# Patient Record
Sex: Female | Born: 1940 | Race: White | Hispanic: No | Marital: Single | Smoking: Former smoker
Health system: Southern US, Community
[De-identification: ages and names within clinical notes are randomized; demographics above are authoritative.]

## PROBLEM LIST (undated history)

## (undated) DIAGNOSIS — D649 Anemia, unspecified: Secondary | ICD-10-CM

## (undated) DIAGNOSIS — R011 Cardiac murmur, unspecified: Secondary | ICD-10-CM

## (undated) DIAGNOSIS — M810 Age-related osteoporosis without current pathological fracture: Secondary | ICD-10-CM

## (undated) DIAGNOSIS — C801 Malignant (primary) neoplasm, unspecified: Secondary | ICD-10-CM

## (undated) HISTORY — DX: Malignant (primary) neoplasm, unspecified: C80.1

## (undated) HISTORY — DX: Anemia, unspecified: D64.9

## (undated) HISTORY — PX: COLONOSCOPY: SHX174

## (undated) HISTORY — DX: Age-related osteoporosis without current pathological fracture: M81.0

## (undated) HISTORY — PX: OTHER SURGICAL HISTORY: SHX169

## (undated) HISTORY — PX: ADENOIDECTOMY: SUR15

## (undated) HISTORY — PX: TONSILLECTOMY AND ADENOIDECTOMY: SUR1326

## (undated) HISTORY — DX: Cardiac murmur, unspecified: R01.1

## (undated) HISTORY — PX: POLYPECTOMY: SHX149

---

## 1997-10-25 ENCOUNTER — Other Ambulatory Visit: Admission: RE | Admit: 1997-10-25 | Discharge: 1997-10-25 | Payer: Self-pay | Admitting: Obstetrics & Gynecology

## 1998-10-31 ENCOUNTER — Other Ambulatory Visit: Admission: RE | Admit: 1998-10-31 | Discharge: 1998-10-31 | Payer: Self-pay | Admitting: Obstetrics & Gynecology

## 1999-03-02 ENCOUNTER — Ambulatory Visit (HOSPITAL_COMMUNITY): Admission: RE | Admit: 1999-03-02 | Discharge: 1999-03-02 | Payer: Self-pay | Admitting: Gastroenterology

## 1999-04-23 ENCOUNTER — Other Ambulatory Visit: Admission: RE | Admit: 1999-04-23 | Discharge: 1999-04-23 | Payer: Self-pay | Admitting: Obstetrics & Gynecology

## 1999-08-29 ENCOUNTER — Encounter: Admission: RE | Admit: 1999-08-29 | Discharge: 1999-08-29 | Payer: Self-pay | Admitting: Obstetrics & Gynecology

## 1999-08-29 ENCOUNTER — Encounter: Payer: Self-pay | Admitting: Obstetrics & Gynecology

## 1999-10-16 ENCOUNTER — Other Ambulatory Visit: Admission: RE | Admit: 1999-10-16 | Discharge: 1999-10-16 | Payer: Self-pay | Admitting: Obstetrics & Gynecology

## 2000-07-31 ENCOUNTER — Other Ambulatory Visit: Admission: RE | Admit: 2000-07-31 | Discharge: 2000-07-31 | Payer: Self-pay | Admitting: Obstetrics & Gynecology

## 2001-03-09 ENCOUNTER — Other Ambulatory Visit: Admission: RE | Admit: 2001-03-09 | Discharge: 2001-03-09 | Payer: Self-pay | Admitting: Obstetrics & Gynecology

## 2001-08-31 ENCOUNTER — Other Ambulatory Visit: Admission: RE | Admit: 2001-08-31 | Discharge: 2001-08-31 | Payer: Self-pay | Admitting: Obstetrics & Gynecology

## 2002-10-04 ENCOUNTER — Other Ambulatory Visit: Admission: RE | Admit: 2002-10-04 | Discharge: 2002-10-04 | Payer: Self-pay | Admitting: Obstetrics & Gynecology

## 2003-09-28 ENCOUNTER — Encounter: Admission: RE | Admit: 2003-09-28 | Discharge: 2003-09-28 | Payer: Self-pay | Admitting: Obstetrics & Gynecology

## 2003-10-05 ENCOUNTER — Other Ambulatory Visit: Admission: RE | Admit: 2003-10-05 | Discharge: 2003-10-05 | Payer: Self-pay | Admitting: Obstetrics & Gynecology

## 2005-04-22 ENCOUNTER — Other Ambulatory Visit: Admission: RE | Admit: 2005-04-22 | Discharge: 2005-04-22 | Payer: Self-pay | Admitting: Obstetrics & Gynecology

## 2005-08-29 ENCOUNTER — Ambulatory Visit: Payer: Self-pay | Admitting: Gastroenterology

## 2005-10-07 ENCOUNTER — Ambulatory Visit: Payer: Self-pay | Admitting: Gastroenterology

## 2005-10-16 ENCOUNTER — Ambulatory Visit (HOSPITAL_COMMUNITY): Admission: RE | Admit: 2005-10-16 | Discharge: 2005-10-16 | Payer: Self-pay | Admitting: Gastroenterology

## 2005-10-23 ENCOUNTER — Ambulatory Visit: Payer: Self-pay | Admitting: Gastroenterology

## 2008-10-13 ENCOUNTER — Inpatient Hospital Stay (HOSPITAL_COMMUNITY): Admission: RE | Admit: 2008-10-13 | Discharge: 2008-10-14 | Payer: Self-pay | Admitting: Orthopedic Surgery

## 2010-04-12 LAB — URINE MICROSCOPIC-ADD ON

## 2010-04-12 LAB — DIFFERENTIAL
Basophils Absolute: 0 10*3/uL (ref 0.0–0.1)
Basophils Relative: 0 % (ref 0–1)
Eosinophils Absolute: 0 10*3/uL (ref 0.0–0.7)
Eosinophils Relative: 1 % (ref 0–5)
Lymphocytes Relative: 18 % (ref 12–46)
Lymphs Abs: 1.3 10*3/uL (ref 0.7–4.0)
Monocytes Absolute: 0.7 10*3/uL (ref 0.1–1.0)
Monocytes Relative: 10 % (ref 3–12)
Neutro Abs: 5.1 10*3/uL (ref 1.7–7.7)
Neutrophils Relative %: 71 % (ref 43–77)

## 2010-04-12 LAB — CBC
HCT: 43 % (ref 36.0–46.0)
Hemoglobin: 14.7 g/dL (ref 12.0–15.0)
MCHC: 34.2 g/dL (ref 30.0–36.0)
MCHC: 34.3 g/dL (ref 30.0–36.0)
MCV: 97.4 fL (ref 78.0–100.0)
Platelets: 365 10*3/uL (ref 150–400)
RBC: 3.78 MIL/uL — ABNORMAL LOW (ref 3.87–5.11)
RBC: 4.42 MIL/uL (ref 3.87–5.11)
RDW: 13.4 % (ref 11.5–15.5)
RDW: 13.6 % (ref 11.5–15.5)
WBC: 7.2 10*3/uL (ref 4.0–10.5)

## 2010-04-12 LAB — BASIC METABOLIC PANEL
BUN: 8 mg/dL (ref 6–23)
CO2: 22 mEq/L (ref 19–32)
CO2: 24 mEq/L (ref 19–32)
Calcium: 8.6 mg/dL (ref 8.4–10.5)
Calcium: 9.1 mg/dL (ref 8.4–10.5)
Chloride: 104 mEq/L (ref 96–112)
Creatinine, Ser: 0.89 mg/dL (ref 0.4–1.2)
Creatinine, Ser: 0.9 mg/dL (ref 0.4–1.2)
GFR calc Af Amer: >60 mL/min (ref >60)
GFR calc Af Amer: >60 mL/min (ref >60)
GFR calc non Af Amer: >60 mL/min (ref >60)
Glucose, Bld: 94 mg/dL (ref 70–99)
Potassium: 3.6 mEq/L (ref 3.5–5.1)
Sodium: 139 mEq/L (ref 135–145)

## 2010-04-12 LAB — PROTIME-INR: INR: 0.98 (ref 0.00–1.49)

## 2010-04-12 LAB — URINALYSIS, ROUTINE W REFLEX MICROSCOPIC
Glucose, UA: NEGATIVE mg/dL
Hgb urine dipstick: NEGATIVE
Specific Gravity, Urine: 1.027 (ref 1.005–1.030)
Urobilinogen, UA: 0.2 mg/dL (ref 0.0–1.0)
pH: 6 (ref 5.0–8.0)

## 2010-05-25 NOTE — Assessment & Plan Note (Signed)
[facility]                           GASTROENTEROLOGY OFFICE NOTE   Carmen, Rose                     MRN:          [id number]  DATE:08/29/2005                            DOB:          May 24, 1940    REASON FOR CONSULTATION:  History of colonic polyps.   HISTORY OF PRESENT ILLNESS:  Carmen Rose is a pleasant 70 year old white  female referred through the courtesy of Dr. Clelia Croft for evaluation.  She has a  history of adenomatous colon polyps.  She was last examined in 2001.  She  has no GI complaints, including abdominal pain, change in bowel habits,  melena or hematochezia.   PAST MEDICAL HISTORY:  Pertinent for arthritis.  She has a history of mild  depression and skin cancer.   FAMILY HISTORY:  Noncontributory.   MEDICATIONS:  Only medication is a baby aspirin and multivitamins.   ALLERGIES:  SHE HAS NO ALLERGIES.   SOCIAL HISTORY:  She quit smoking this year.  She drinks rarely.  She is  single and works for [employer]   REVIEW OF SYSTEMS:  Was reviewed and is negative.   PHYSICAL EXAMINATION:  VITAL SIGNS:  On exam, pulse 84, blood pressure  108/60, weight 140.  HEENT: EOMI. PERRLA. Sclerae are anicteric.  Conjunctivae are pink.  NECK:  Supple without thyromegaly, adenopathy or carotid bruits.  CHEST:  Clear to auscultation and percussion without adventitious sounds.  CARDIAC:  Regular rhythm; normal S1 S2.  There are no murmurs, gallops or  rubs.  ABDOMEN:  Bowel sounds are normoactive.  Abdomen is soft, non-tender and non-  distended.  There are no abdominal masses, tenderness, splenic enlargement  or hepatomegaly.  EXTREMITIES:  Full range of motion.  No cyanosis, clubbing or edema.  RECTAL:  Deferred.   IMPRESSION:  History of colonic polyps.   RECOMMENDATION:  Colonoscopy.                                   Barbette Hair. Arlyce Dice, MD, [facility]   RDK/MedQ  DD:  08/29/2005  DT:  08/29/2005  Job #:  [id number]   cc:   Kari Baars, MD

## 2010-09-24 ENCOUNTER — Other Ambulatory Visit: Payer: Self-pay | Admitting: Internal Medicine

## 2010-09-24 DIAGNOSIS — Z1231 Encounter for screening mammogram for malignant neoplasm of breast: Secondary | ICD-10-CM

## 2010-10-08 ENCOUNTER — Ambulatory Visit
Admission: RE | Admit: 2010-10-08 | Discharge: 2010-10-08 | Disposition: A | Discharge status: Home / Self Care | Payer: Medicare Other | Source: Ambulatory Visit | Attending: Internal Medicine | Admitting: Internal Medicine

## 2010-10-08 DIAGNOSIS — Z1231 Encounter for screening mammogram for malignant neoplasm of breast: Secondary | ICD-10-CM

## 2011-10-14 ENCOUNTER — Telehealth: Payer: Self-pay | Admitting: Gastroenterology

## 2011-10-14 ENCOUNTER — Other Ambulatory Visit: Payer: Self-pay | Admitting: Internal Medicine

## 2011-10-14 DIAGNOSIS — Z1231 Encounter for screening mammogram for malignant neoplasm of breast: Secondary | ICD-10-CM

## 2011-10-14 NOTE — Telephone Encounter (Signed)
Pt has a form from [employer] to have the date of her colonoscopy and where and when it was done. States if she fills this out she can get a prepaid visa card sent to her. Let pt know she could bring the form in and let us look at it to see if it is something that we could fill out for her. Pt verbalized understanding.

## 2011-11-11 ENCOUNTER — Ambulatory Visit
Admission: RE | Admit: 2011-11-11 | Discharge: 2011-11-11 | Disposition: A | Discharge status: Home / Self Care | Payer: Medicare Other | Source: Ambulatory Visit | Attending: Internal Medicine | Admitting: Internal Medicine

## 2011-11-11 DIAGNOSIS — Z1231 Encounter for screening mammogram for malignant neoplasm of breast: Secondary | ICD-10-CM

## 2012-08-14 ENCOUNTER — Encounter: Payer: Self-pay | Admitting: Gastroenterology

## 2012-11-26 ENCOUNTER — Other Ambulatory Visit: Payer: Self-pay | Admitting: Internal Medicine

## 2012-11-26 DIAGNOSIS — Z1231 Encounter for screening mammogram for malignant neoplasm of breast: Secondary | ICD-10-CM

## 2012-11-26 DIAGNOSIS — Z780 Asymptomatic menopausal state: Secondary | ICD-10-CM

## 2012-11-26 DIAGNOSIS — M810 Age-related osteoporosis without current pathological fracture: Secondary | ICD-10-CM

## 2012-12-21 ENCOUNTER — Ambulatory Visit
Admission: RE | Admit: 2012-12-21 | Discharge: 2012-12-21 | Disposition: A | Discharge status: Home / Self Care | Payer: Medicare Other | Source: Ambulatory Visit | Attending: Internal Medicine | Admitting: Internal Medicine

## 2012-12-21 DIAGNOSIS — M810 Age-related osteoporosis without current pathological fracture: Secondary | ICD-10-CM

## 2012-12-21 DIAGNOSIS — Z1231 Encounter for screening mammogram for malignant neoplasm of breast: Secondary | ICD-10-CM

## 2012-12-21 DIAGNOSIS — Z780 Asymptomatic menopausal state: Secondary | ICD-10-CM

## 2013-01-11 ENCOUNTER — Telehealth: Payer: Self-pay | Admitting: Gastroenterology

## 2013-01-11 NOTE — Telephone Encounter (Signed)
Pt had follow-up colon done 10/2005, no intervention was required. Pt had some adenomatous polyps on previous colon in 2001. Pt received a recall letter and she is calling stating that her last procedure was done at the hospital. Pt wants to know if she needs next procedure done at the hospital. Please advise.

## 2013-01-11 NOTE — Telephone Encounter (Signed)
I need a copy of her colonoscopy reports

## 2013-01-13 ENCOUNTER — Encounter: Payer: Self-pay | Admitting: Gastroenterology

## 2013-01-13 NOTE — Telephone Encounter (Signed)
Colon reports placed on Dr. Kelby Fam desk for review.

## 2013-01-14 NOTE — Telephone Encounter (Signed)
Does not have to be done in the hospital.

## 2013-01-15 NOTE — Telephone Encounter (Signed)
Left message for pt to call back  °

## 2013-01-18 NOTE — Telephone Encounter (Signed)
Pt aware and previsit and colon appts scheduled. Pt aware of appts.

## 2013-02-05 ENCOUNTER — Encounter: Payer: Self-pay | Admitting: Gastroenterology

## 2013-03-01 ENCOUNTER — Ambulatory Visit (AMBULATORY_SURGERY_CENTER): Payer: Self-pay | Admitting: *Deleted

## 2013-03-01 ENCOUNTER — Telehealth: Payer: Self-pay | Admitting: Gastroenterology

## 2013-03-01 VITALS — Ht 67.0 in | Wt 140.8 lb

## 2013-03-01 DIAGNOSIS — Z86010 Personal history of colonic polyps: Secondary | ICD-10-CM

## 2013-03-01 MED ORDER — MOVIPREP 100 G PO SOLR: 1.0000 | Freq: Once | ORAL | Status: DC

## 2013-03-01 NOTE — Telephone Encounter (Signed)
Spoke with patient. She is unable to pay for the $70 Moviprep. Coupon left at front desk for "free" moviprep, patient will come by 4 th floor desk to pick this up tomorrow. Patient verbally understands. Thanks, RM

## 2013-03-01 NOTE — Progress Notes (Signed)
No soy allergy or egg allergy. ewm No home 02 or cpap use. ewm No problems with past sedation. ewm The smell of the 02 tubing makes pt nauseated. Please place after sedated. ewm Pt declined emmi video. ewm

## 2013-03-03 ENCOUNTER — Telehealth: Payer: Self-pay | Admitting: Gastroenterology

## 2013-03-03 NOTE — Telephone Encounter (Signed)
Pt wants [place] to be aware that she has a rash on her back that has been there since November.  She did not want [place] to think that it was new.  Instructed pt to make nurse in procedure room aware day of procedure.

## 2013-03-04 ENCOUNTER — Encounter: Payer: Self-pay | Admitting: Gastroenterology

## 2013-03-08 ENCOUNTER — Encounter: Payer: Self-pay | Admitting: Gastroenterology

## 2013-03-08 ENCOUNTER — Ambulatory Visit (AMBULATORY_SURGERY_CENTER): Payer: Medicare Other | Admitting: Gastroenterology

## 2013-03-08 ENCOUNTER — Encounter: Payer: Medicare Other | Admitting: Gastroenterology

## 2013-03-08 VITALS — BP 137/65 | HR 74 | Temp 96.7°F | Resp 15 | Ht 67.0 in | Wt 140.0 lb

## 2013-03-08 DIAGNOSIS — Z86010 Personal history of colonic polyps: Secondary | ICD-10-CM

## 2013-03-08 MED ORDER — SODIUM CHLORIDE 0.9 % IV SOLN: 500.0000 mL | INTRAVENOUS | Status: DC

## 2013-03-08 NOTE — Op Note (Signed)
[place]  [address] [place], [id number]   COLONOSCOPY PROCEDURE REPORT  PATIENT: Carmen, Rose  MR#: [id number] BIRTHDATE: 07-09-1940 , 75  yrs. old GENDER: Female ENDOSCOPIST: Inda Castle, MD REFERRED DZ:[id number] Dione Housekeeper, M.D. PROCEDURE DATE:  03/08/2013 PROCEDURE:   Colonoscopy, diagnostic First Screening Colonoscopy - Avg.  risk and is 50 yrs.  old or older - No.  Prior Negative Screening - Now for repeat screening. N/A  History of Adenoma - Now for follow-up colonoscopy & has been > or = to 3 yrs.  Yes hx of adenoma.  Has been 3 or more years since last colonoscopy.  Polyps Removed Today? No.  Recommend repeat exam, <10 yrs? No. ASA CLASS:   Class II INDICATIONS:Patient's personal history of adenomatous colon polyps. 1998.  2007 colonoscopy negative for polyps MEDICATIONS: MAC sedation, administered by CRNA and propofol (Diprivan) 200mg  IV  DESCRIPTION OF PROCEDURE:   After the risks benefits and alternatives of the procedure were thoroughly explained, informed consent was obtained.  A digital rectal exam revealed no abnormalities of the rectum.   The LB [id number]  endoscope was introduced through the anus and advanced to the cecum, which was identified by both the appendix and ileocecal valve. No adverse events experienced.   The quality of the prep was Suprep good  The instrument was then slowly withdrawn as the colon was fully examined.      COLON FINDINGS: A normal appearing cecum, ileocecal valve, and appendiceal orifice were identified.  The ascending, hepatic flexure, transverse, splenic flexure, descending, sigmoid colon and rectum appeared unremarkable.  No polyps or cancers were seen. Retroflexed views revealed no abnormalities. The time to cecum=4 minutes 07 seconds.  Withdrawal time=8 minutes 15 seconds.  The scope was withdrawn and the procedure completed. COMPLICATIONS: There were no complications.  ENDOSCOPIC  IMPRESSION: Normal colon  RECOMMENDATIONS: Given your age, you will not need another colonoscopy for colon cancer screening or polyp surveillance.  These types of tests usually stop around the age 10.   eSigned:  Inda Castle, MD 03/08/2013 2:01 PM   cc:   PATIENT NAME:  Carmen, Rose MR#: [id number]

## 2013-03-08 NOTE — Patient Instructions (Signed)
Impressions/recommendations:  Normal colon  YOU HAD AN ENDOSCOPIC PROCEDURE TODAY AT [facility]: Refer to the procedure report that was given to you for any specific questions about what was found during the examination.  If the procedure report does not answer your questions, please call your gastroenterologist to clarify.  If you requested that your care partner not be given the details of your procedure findings, then the procedure report has been included in a sealed envelope for you to review at your convenience later.  YOU SHOULD EXPECT: Some feelings of bloating in the abdomen. Passage of more gas than usual.  Walking can help get rid of the air that was put into your GI tract during the procedure and reduce the bloating. If you had a lower endoscopy (such as a colonoscopy or flexible sigmoidoscopy) you may notice spotting of blood in your stool or on the toilet paper. If you underwent a bowel prep for your procedure, then you may not have a normal bowel movement for a few days.  DIET: Your first meal following the procedure should be a light meal and then it is ok to progress to your normal diet.  A half-sandwich or bowl of soup is an example of a good first meal.  Heavy or fried foods are harder to digest and may make you feel nauseous or bloated.  Likewise meals heavy in dairy and vegetables can cause extra gas to form and this can also increase the bloating.  Drink plenty of fluids but you should avoid alcoholic beverages for 24 hours.  ACTIVITY: Your care partner should take you home directly after the procedure.  You should plan to take it easy, moving slowly for the rest of the day.  You can resume normal activity the day after the procedure however you should NOT DRIVE or use heavy machinery for 24 hours (because of the sedation medicines used during the test).    SYMPTOMS TO REPORT IMMEDIATELY: A gastroenterologist can be reached at any hour.  During normal business  hours, 8:30 AM to 5:00 PM Monday through Friday, call [phone].  After hours and on weekends, please call the GI answering service at [phone] who will take a message and have the physician on call contact you.   Following lower endoscopy (colonoscopy or flexible sigmoidoscopy):  Excessive amounts of blood in the stool  Significant tenderness or worsening of abdominal pains  Swelling of the abdomen that is new, acute  Fever of 100F or higher  FOLLOW UP: If any biopsies were taken you will be contacted by phone or by letter within the next 1-3 weeks.  Call your gastroenterologist if you have not heard about the biopsies in 3 weeks.  Our staff will call the home number listed on your records the next business day following your procedure to check on you and address any questions or concerns that you may have at that time regarding the information given to you following your procedure. This is a courtesy call and so if there is no answer at the home number and we have not heard from you through the emergency physician on call, we will assume that you have returned to your regular daily activities without incident.  SIGNATURES/CONFIDENTIALITY: You and/or your care partner have signed paperwork which will be entered into your electronic medical record.  These signatures attest to the fact that that the information above on your After Visit Summary has been reviewed and is understood.  Full responsibility  of the confidentiality of this discharge information lies with you and/or your care-partner.

## 2013-03-08 NOTE — Progress Notes (Signed)
Report to pacu rn, vss, bbs=clear 

## 2013-03-09 ENCOUNTER — Telehealth: Payer: Self-pay | Admitting: *Deleted

## 2013-03-09 NOTE — Telephone Encounter (Signed)
  Follow up Call-  Call back number 03/08/2013  Post procedure Call Back phone  # [phone]  Permission to leave phone message Yes     Patient questions:  Do you have a fever, pain , or abdominal swelling? no Pain Score  0 *  Have you tolerated food without any problems? yes  Have you been able to return to your normal activities? yes  Do you have any questions about your discharge instructions: Diet   no Medications  no Follow up visit  no  Do you have questions or concerns about your Care? no  Actions: * If pain score is 4 or above: No action needed, pain <4.

## 2014-01-25 ENCOUNTER — Other Ambulatory Visit: Payer: Self-pay

## 2014-01-25 DIAGNOSIS — Z1231 Encounter for screening mammogram for malignant neoplasm of breast: Secondary | ICD-10-CM

## 2014-01-31 ENCOUNTER — Ambulatory Visit: Payer: Self-pay

## 2014-02-07 ENCOUNTER — Ambulatory Visit
Admission: RE | Admit: 2014-02-07 | Discharge: 2014-02-07 | Disposition: A | Discharge status: Home / Self Care | Payer: Medicare Other | Source: Ambulatory Visit

## 2014-02-07 DIAGNOSIS — Z1231 Encounter for screening mammogram for malignant neoplasm of breast: Secondary | ICD-10-CM

## 2014-08-02 ENCOUNTER — Other Ambulatory Visit: Payer: Self-pay | Admitting: Internal Medicine

## 2014-08-02 DIAGNOSIS — M810 Age-related osteoporosis without current pathological fracture: Secondary | ICD-10-CM

## 2014-12-23 ENCOUNTER — Other Ambulatory Visit: Payer: Medicare Other

## 2014-12-26 ENCOUNTER — Ambulatory Visit
Admission: RE | Admit: 2014-12-26 | Discharge: 2014-12-26 | Disposition: A | Discharge status: Home / Self Care | Payer: Medicare Other | Source: Ambulatory Visit | Attending: Internal Medicine | Admitting: Internal Medicine

## 2014-12-26 DIAGNOSIS — M810 Age-related osteoporosis without current pathological fracture: Secondary | ICD-10-CM

## 2015-01-23 ENCOUNTER — Other Ambulatory Visit: Payer: Self-pay

## 2015-01-23 DIAGNOSIS — Z1231 Encounter for screening mammogram for malignant neoplasm of breast: Secondary | ICD-10-CM

## 2015-02-13 ENCOUNTER — Ambulatory Visit
Admission: RE | Admit: 2015-02-13 | Discharge: 2015-02-13 | Disposition: A | Discharge status: Home / Self Care | Payer: Medicare Other | Source: Ambulatory Visit

## 2015-02-13 DIAGNOSIS — Z1231 Encounter for screening mammogram for malignant neoplasm of breast: Secondary | ICD-10-CM

## 2015-07-08 ENCOUNTER — Encounter (HOSPITAL_COMMUNITY): Payer: Self-pay

## 2015-07-08 ENCOUNTER — Emergency Department (HOSPITAL_COMMUNITY)
Admission: EM | Admit: 2015-07-08 | Discharge: 2015-07-08 | Disposition: A | Discharge status: Home / Self Care | Payer: Worker's Compensation | Attending: Emergency Medicine | Admitting: Emergency Medicine

## 2015-07-08 ENCOUNTER — Emergency Department (HOSPITAL_COMMUNITY): Payer: Worker's Compensation

## 2015-07-08 DIAGNOSIS — W010XXA Fall on same level from slipping, tripping and stumbling without subsequent striking against object, initial encounter: Secondary | ICD-10-CM | POA: Diagnosis not present

## 2015-07-08 DIAGNOSIS — M810 Age-related osteoporosis without current pathological fracture: Secondary | ICD-10-CM | POA: Insufficient documentation

## 2015-07-08 DIAGNOSIS — Z7982 Long term (current) use of aspirin: Secondary | ICD-10-CM | POA: Diagnosis not present

## 2015-07-08 DIAGNOSIS — Y929 Unspecified place or not applicable: Secondary | ICD-10-CM | POA: Insufficient documentation

## 2015-07-08 DIAGNOSIS — Z79899 Other long term (current) drug therapy: Secondary | ICD-10-CM | POA: Insufficient documentation

## 2015-07-08 DIAGNOSIS — S8991XA Unspecified injury of right lower leg, initial encounter: Secondary | ICD-10-CM | POA: Diagnosis present

## 2015-07-08 DIAGNOSIS — Y9389 Activity, other specified: Secondary | ICD-10-CM | POA: Diagnosis not present

## 2015-07-08 DIAGNOSIS — Z87891 Personal history of nicotine dependence: Secondary | ICD-10-CM | POA: Insufficient documentation

## 2015-07-08 DIAGNOSIS — Z85828 Personal history of other malignant neoplasm of skin: Secondary | ICD-10-CM | POA: Diagnosis not present

## 2015-07-08 DIAGNOSIS — Y990 Civilian activity done for income or pay: Secondary | ICD-10-CM | POA: Diagnosis not present

## 2015-07-08 DIAGNOSIS — S82001A Unspecified fracture of right patella, initial encounter for closed fracture: Secondary | ICD-10-CM | POA: Insufficient documentation

## 2015-07-08 MED ORDER — OXYCODONE-ACETAMINOPHEN 5-325 MG PO TABS
1.0000 | ORAL_TABLET | Freq: Once | ORAL | Status: AC
  Administered 2015-07-08: 1 via ORAL
  Filled 2015-07-08: qty 1

## 2015-07-08 MED ORDER — HYDROCODONE-ACETAMINOPHEN 5-325 MG PO TABS: 1.0000 | ORAL_TABLET | ORAL | Status: DC | PRN

## 2015-07-08 NOTE — ED Notes (Signed)
Pt c/o R knee pain r/t a trip and fall.  Pain score 8/10 w/ movement.  Swelling noted.

## 2015-07-08 NOTE — ED Provider Notes (Signed)
CSN: [id number]     Arrival date & time 07/08/15  1057 History   First MD Initiated Contact with Patient 07/08/15 1146     Chief Complaint  Patient presents with  . Fall  . Knee Pain   HPI Comments: 75 year old female presents with right anterior knee pain after a fall earlier today. Past medical history significant for anemia and osteoporosis. Patient states she was works at [employer] and she tripped over a cord. She fell directly onto her right knee. She also reports bruising on her left wrist however she states it is not painful. She denies pain when lying still however reports pain with range of motion. Denies numbness or tingling. She denies blood thinners but does take 81mg  ASA.  Patient is a 75 y.o. female presenting with fall and knee pain.  Fall Associated symptoms include arthralgias. Pertinent negatives include no numbness or weakness.  Knee Pain   Past Medical History  Diagnosis Date  . Anemia     50 yrs ago  . Cancer ([place])     skin cancer right arm  . Heart murmur     prolapsed mitral valve  . Osteoporosis    Past Surgical History  Procedure Laterality Date  . Right arm fracture      humerus  . Colonoscopy    . Tonsillectomy and adenoidectomy    . Adenoidectomy    . Polypectomy     Family History  Problem Relation Age of Onset  . Colon cancer Neg Hx   . Rectal cancer Neg Hx   . Stomach cancer Neg Hx   . Lung cancer Father   . Cervical cancer Mother   . Heart disease Mother    Social History  Substance Use Topics  . Smoking status: Former Research scientist (life sciences)  . Smokeless tobacco: Never Used  . Alcohol Use: Yes     Comment: occasional   OB History    No data available     Review of Systems  Musculoskeletal: Positive for arthralgias and gait problem.  Neurological: Negative for syncope, weakness and numbness.  All other systems reviewed and are negative.     Allergies  Milk-related compounds; Other; Sudafed; Adhesive; and Caladryl  Home Medications    Prior to Admission medications   Medication Sig Start Date End Date Taking? Authorizing Provider  aspirin 81 MG chewable tablet Chew 81 mg by mouth daily.   Yes Historical Provider, MD  Biotin 1000 MCG tablet Take 500 mcg by mouth daily.   Yes Historical Provider, MD  Calcium Carb-Cholecalciferol (CALCIUM 600+D3) 600-800 MG-UNIT TABS Take 1 capsule by mouth 2 (two) times daily before a meal.   Yes Historical Provider, MD  Cholecalciferol (VITAMIN D-3 PO) Take 1,000 Units by mouth daily.   Yes Historical Provider, MD  Multiple Vitamins-Minerals (MULTIVITAMIN PO) Take 1 capsule by mouth daily.   Yes Historical Provider, MD  HYDROcodone-acetaminophen (NORCO/VICODIN) 5-325 MG tablet Take 1-2 tablets by mouth every 4 (four) hours as needed. 07/08/15   Recardo Evangelist, PA-C   BP 141/80 mmHg  Pulse 71  Temp(Src) 97.8 F (36.6 C) (Oral)  Resp 17  SpO2 97%   Physical Exam  Constitutional: She is oriented to person, place, and time. She appears well-developed and well-nourished. No distress.  HENT:  Head: Normocephalic and atraumatic.  Eyes: Conjunctivae are normal. Pupils are equal, round, and reactive to light. Right eye exhibits no discharge. Left eye exhibits no discharge. No scleral icterus.  Neck: Normal range of motion.  Cardiovascular: Normal rate.   Pulmonary/Chest: Effort normal. No respiratory distress.  Abdominal: She exhibits no distension.  Musculoskeletal:  Right kneel: Moderate swelling. No deformity. Significant tenderness to palpation over the superior anterior patella. Decreased ROM. N/V intact.    Neurological: She is alert and oriented to person, place, and time.  Skin: Skin is warm and dry.  Psychiatric: She has a normal mood and affect. Her behavior is normal.    ED Course  Procedures (including critical care time) Labs Review Labs Reviewed - No data to display  Imaging Review Dg Knee Complete 4 Views Right  07/08/2015  CLINICAL DATA:  Recent fall with  anterior knee pain, initial and EXAM: RIGHT KNEE - COMPLETE 4+ VIEW COMPARISON:  None. FINDINGS: Superior patellar fracture is noted with associated joint effusion. No other bony abnormality is seen. IMPRESSION: Superior patellar fracture with associated joint effusion. Electronically Signed   By: Inez Catalina M.D.   On: 07/08/2015 11:46   I have personally reviewed and evaluated these images and lab results as part of my medical decision-making.   EKG Interpretation None      MDM   Final diagnoses:  Patella fracture, right, closed, initial encounter   75 year old female presents with a patella fracture s/p mechanical fall. Patient is afebrile, not tachycardic or tachypneic, and not hypoxic. BP mildly elevated. Percocet given for pain. Xray shows patella fracture. Knee immobilizer applied. Pt has orthopedic doctor and will follow up. Patient is NAD, non-toxic, with stable VS. Patient is informed of clinical course, understands medical decision making process, and agrees with plan. Opportunity for questions provided and all questions answered. Return precautions given.      Recardo Evangelist, PA-C 07/08/15 [place], DO 07/08/15 1428

## 2015-07-08 NOTE — Discharge Instructions (Signed)
Knee Fracture, Adult A knee fracture is a break in a bone of the knee. The break may be in the kneecap (patella), the lower part of the thigh bone (femur), or the upper part of the shin bone (tibia). There are several types of fractures. They include:  Stable. In this type of fracture, the bones of the knee remain in place after the break.  Displaced. In this type of fracture, the bones no longer line up after the break.  Comminuted. In this type of fracture, the bone breaks into several pieces.  Open. In this type of fracture, the broken bone comes through the skin. CAUSES This injury is usually caused by a fall. This injury can happen because of the impact of the fall or from a violent contraction of the leg muscles before you hit the ground. It can also result from a car accident or a collision with a hard surface. RISK FACTORS This injury is more likely to develop in people who:  Are female.  Are 90-62 years old.  Participate in high-energy sports.  Have a condition that weakens the bones, such as osteoporosis.  Have had a knee replacement. SYMPTOMS Symptoms of this injury include:  Pain.  Swelling.  Bruising.  Inability to bend your knee.  Misshapen knee.  Inability to walk.  Inability to use your injured leg to support your body weight. DIAGNOSIS This injury is diagnosed with a physical exam. Your health care provider may also order:  Imaging studies, such as an X-ray, CT scan, MRI scan, or ultrasound.  A procedure called arthroscopy to view the inside of your knee with a small camera. TREATMENT Treatment for this injury may involve:  Wearing a splint until swelling goes down.  Wearing a cast to keep the fractured bone from moving while it heals. A cast is usually put on after swelling has gone down.  Surgery to move a bone back into place. HOME CARE INSTRUCTIONS If You Have a Splint:  Wear it as directed by your health care provider. Remove it only as  directed by your health care provider.  Loosen the splint if your toes become numb and tingle, or if they turn cold and blue.  Do not put pressure on any part of your splint. If You Have a Cast:  Do not stick anything inside the cast to scratch your skin. Doing that increases your risk of infection.  Check the skin around the cast every day. Report any concerns to your health care provider. You may put lotion on dry skin around the edges of the cast. Do not apply lotion to the skin underneath the cast. Bathing  Cover the cast or splint with a watertight plastic bag to protect it from water while you take a bath or a shower. Do not let the cast or splint get wet. Managing Pain, Stiffness, and Swelling  If directed, apply ice to the injured area:  Put ice in a plastic bag.  Place a towel between your skin and the bag.  Leave the ice on for 20 minutes, 2-3 times a day.  Move your toes often to avoid stiffness and to lessen swelling.  Raise the injured area above the level of your heart while you are lying down. Driving  Do not drive or operate heavy machinery while taking pain medicine.  Do not drive while wearing a cast or splint on a hand or foot that you use for driving. Activity  Return to your normal activities as directed  by your health care provider. Ask your health care provider what activities are safe for you. General Instructions  Do not put pressure on any part of the cast or splint until it is fully hardened. This may take several hours.  Keep the cast or splint clean and dry.  Do not use any tobacco products, including cigarettes, chewing tobacco, or electronic cigarettes. Tobacco can delay bone healing. If you need help quitting, ask your health care provider.  Take medicines only as directed by your health care provider.  Keep all follow-up visits as directed by your health care provider. This is important. SEEK MEDICAL CARE IF:  You have knee pain and  swelling.  You have trouble walking.  Your cast becomes wet or damaged or suddenly feels too tight. SEEK IMMEDIATE MEDICAL CARE IF:  Your pain and swelling get worse.  You have severe pain below the fracture.  Your skin or toenails turn blue or gray, feel cold, or become numb.  You have fluid, blood, or pus coming from under your cast.   This information is not intended to replace advice given to you by your health care provider. Make sure you discuss any questions you have with your health care provider.   Document Released: 11/06/2005 Document Revised: 01/14/2014 Document Reviewed: 08/25/2013 Elsevier Interactive Patient Education [employer].

## 2016-01-30 ENCOUNTER — Other Ambulatory Visit: Payer: Self-pay | Admitting: Internal Medicine

## 2016-01-30 DIAGNOSIS — Z1231 Encounter for screening mammogram for malignant neoplasm of breast: Secondary | ICD-10-CM

## 2016-02-19 ENCOUNTER — Ambulatory Visit
Admission: RE | Admit: 2016-02-19 | Discharge: 2016-02-19 | Disposition: A | Discharge status: Home / Self Care | Payer: Medicare Other | Source: Ambulatory Visit | Attending: Internal Medicine | Admitting: Internal Medicine

## 2016-02-19 DIAGNOSIS — Z1231 Encounter for screening mammogram for malignant neoplasm of breast: Secondary | ICD-10-CM

## 2017-01-01 ENCOUNTER — Encounter (HOSPITAL_COMMUNITY): Payer: Self-pay | Admitting: Nurse Practitioner

## 2017-01-01 ENCOUNTER — Emergency Department (HOSPITAL_COMMUNITY): Payer: Medicare Other

## 2017-01-01 ENCOUNTER — Emergency Department (HOSPITAL_COMMUNITY)
Admission: EM | Admit: 2017-01-01 | Discharge: 2017-01-01 | Disposition: A | Discharge status: Home / Self Care | Payer: Medicare Other | Attending: Emergency Medicine | Admitting: Emergency Medicine

## 2017-01-01 ENCOUNTER — Other Ambulatory Visit: Payer: Self-pay

## 2017-01-01 DIAGNOSIS — S29012A Strain of muscle and tendon of back wall of thorax, initial encounter: Secondary | ICD-10-CM | POA: Diagnosis not present

## 2017-01-01 DIAGNOSIS — T148XXA Other injury of unspecified body region, initial encounter: Secondary | ICD-10-CM

## 2017-01-01 DIAGNOSIS — Z79899 Other long term (current) drug therapy: Secondary | ICD-10-CM | POA: Diagnosis not present

## 2017-01-01 DIAGNOSIS — S298XXA Other specified injuries of thorax, initial encounter: Secondary | ICD-10-CM | POA: Diagnosis present

## 2017-01-01 DIAGNOSIS — M546 Pain in thoracic spine: Secondary | ICD-10-CM

## 2017-01-01 DIAGNOSIS — Y93H2 Activity, gardening and landscaping: Secondary | ICD-10-CM | POA: Diagnosis not present

## 2017-01-01 DIAGNOSIS — Y929 Unspecified place or not applicable: Secondary | ICD-10-CM | POA: Insufficient documentation

## 2017-01-01 DIAGNOSIS — Z85828 Personal history of other malignant neoplasm of skin: Secondary | ICD-10-CM | POA: Diagnosis not present

## 2017-01-01 DIAGNOSIS — Z7982 Long term (current) use of aspirin: Secondary | ICD-10-CM | POA: Diagnosis not present

## 2017-01-01 DIAGNOSIS — Y999 Unspecified external cause status: Secondary | ICD-10-CM | POA: Insufficient documentation

## 2017-01-01 DIAGNOSIS — Z87891 Personal history of nicotine dependence: Secondary | ICD-10-CM | POA: Diagnosis not present

## 2017-01-01 DIAGNOSIS — W010XXA Fall on same level from slipping, tripping and stumbling without subsequent striking against object, initial encounter: Secondary | ICD-10-CM | POA: Diagnosis not present

## 2017-01-01 NOTE — ED Provider Notes (Signed)
[facility] DEPT Provider Note   CSN: [id number] Arrival date & time: 01/01/17  4580     History   Chief Complaint Chief Complaint  Patient presents with  . Back Pain  . Fall    HPI Carmen Rose is a 76 y.o. female.  HPI  76 year old female presents with concern for fall yesterday with left-sided rib and back pain.  Reports that she was carrying 2 bags of leaves, and 1 got caught on something, and pulled her and she lost her balance and fell down.  Reports she fell forwards, landing on her knees and hand and bracing herself.  Reports she skinned her knee but does not have any significant pain there and has been ambulating without problems.  Denies any hand pain.  Reports her glasses had also slightly cut the side of her face.  Denies headache, head trauma, nausea or vomiting.  Denies neck pain.  Denies numbness, weakness.  Reports she continued 2 hours after the fall initially, and about 1 hour later noted at epigastric and left upper quadrant pain, and last night noted mid back pain.  Reports the pain is worse with movements and palpation.  Denies chest pain, shortness of breath.  Pain is well controlled unless she lays on it wrong or moves.  Back pain does come and go.   Past Medical History:  Diagnosis Date  . Anemia    50 yrs ago  . Cancer ([place])    skin cancer right arm  . Heart murmur    prolapsed mitral valve  . Osteoporosis     There are no active problems to display for this patient.   Past Surgical History:  Procedure Laterality Date  . ADENOIDECTOMY    . COLONOSCOPY    . POLYPECTOMY    . right arm fracture     humerus  . TONSILLECTOMY AND ADENOIDECTOMY      OB History    No data available       Home Medications    Prior to Admission medications   Medication Sig Start Date End Date Taking? Authorizing Provider  aspirin 81 MG chewable tablet Chew 81 mg by mouth daily.   Yes [provider]  Biotin 1000 MCG  tablet Take 500 mcg by mouth daily.   Yes [provider]  Calcium Carb-Cholecalciferol (CALCIUM 600+D3) 600-800 MG-UNIT TABS Take 1 capsule by mouth 2 (two) times daily before a meal.   Yes [provider]  Cholecalciferol (VITAMIN D-3 PO) Take 1,000 Units by mouth daily.   Yes [provider]  ibuprofen (ADVIL,MOTRIN) 200 MG tablet Take 200 mg by mouth every 6 (six) hours as needed for mild pain.   Yes [provider]  Multiple Vitamins-Minerals (MULTIVITAMIN PO) Take 1 capsule by mouth daily.   Yes [provider]  HYDROcodone-acetaminophen (NORCO/VICODIN) 5-325 MG tablet Take 1-2 tablets by mouth every 4 (four) hours as needed. Patient not taking: Reported on 01/01/2017 07/08/15   Recardo Evangelist, PA-C    Family History Family History  Problem Relation Age of Onset  . Cervical cancer Mother   . Heart disease Mother   . Lung cancer Father   . Colon cancer Neg Hx   . Rectal cancer Neg Hx   . Stomach cancer Neg Hx     Social History Social History   Tobacco Use  . Smoking status: Former Research scientist (life sciences)  . Smokeless tobacco: Never Used  Substance Use Topics  . Alcohol use: Yes  Comment: occasional  . Drug use: No     Allergies   Alendronate; Lipitor [atorvastatin]; Milk-related compounds; Other; Sudafed [pseudoephedrine hcl]; Adhesive [tape]; and Caladryl [pramoxine-calamine]   Review of Systems Review of Systems  Constitutional: Negative for fever.  HENT: Negative for sore throat.   Eyes: Negative for visual disturbance.  Respiratory: Negative for cough and shortness of breath.   Cardiovascular: Positive for chest pain.  Gastrointestinal: Positive for abdominal pain. Negative for nausea and vomiting.  Genitourinary: Negative for difficulty urinating.  Musculoskeletal: Positive for back pain. Negative for neck pain.  Skin: Negative for rash.  Neurological: Negative for syncope and headaches.     Physical Exam Updated Vital  Signs BP (!) 151/76   Pulse 83   Temp 97.8 F (36.6 C) (Oral)   Resp 17   Ht 5\' 7"  (1.702 m)   Wt 61.2 kg (135 lb)   SpO2 100%   BMI 21.14 kg/m   Physical Exam  Constitutional: She is oriented to person, place, and time. She appears well-developed and well-nourished. No distress.  HENT:  Head: Normocephalic and atraumatic.  Eyes: Conjunctivae and EOM are normal.  Neck: Normal range of motion.  Cardiovascular: Normal rate, regular rhythm, normal heart sounds and intact distal pulses. Exam reveals no gallop and no friction rub.  No murmur heard. Pulmonary/Chest: Effort normal and breath sounds normal. No respiratory distress. She has no wheezes. She has no rales. She exhibits tenderness.  Abdominal: Soft. She exhibits no distension. There is no tenderness. There is no guarding.  Musculoskeletal: She exhibits no edema.       Cervical back: She exhibits no tenderness and no bony tenderness.       Thoracic back: She exhibits tenderness. She exhibits no bony tenderness.  Neurological: She is alert and oriented to person, place, and time.  Skin: Skin is warm and dry. No rash noted. She is not diaphoretic. No erythema.  Nursing note and vitals reviewed.    ED Treatments / Results  Labs (all labs ordered are listed, but only abnormal results are displayed) Labs Reviewed - No data to display  EKG  EKG Interpretation  Date/Time:  Wednesday January 01 2017 07:53:11 EST Ventricular Rate:  82 PR Interval:    QRS Duration: 88 QT Interval:  378 QTC Calculation: 442 R Axis:   72 Text Interpretation:  Sinus rhythm Borderline short PR interval Right atrial enlargement No significant change since last tracing Confirmed by Gareth Morgan [phone]) on 01/01/2017 7:56:30 AM       Radiology Dg Chest 2 View  Result Date: 01/01/2017 CLINICAL DATA:  Right-sided upper back pain after fall yesterday. EXAM: CHEST  2 VIEW COMPARISON:  Thoracic spine series of today's date FINDINGS: The lungs  are mildly hyperinflated. There is a faint 4 mm diameter nodular density peripherally in the left upper lobe. The lungs are clear elsewhere. The heart and pulmonary vascularity are normal. The mediastinum is normal in width. There is no pleural effusion. There is subtle loss of height along the superior endplate of T6 demonstrated to better advantage on the accompanying thoracic spine series. IMPRESSION: Mild hyperinflation may reflect chronic bronchitis. No acute pneumonia nor CHF. 4 mm nodular density on the frontal view only present peripherally in the left upper lobe. Given lack of any previous studies with which to compare, chest CT scanning is recommended. Slight loss of height of the body of T6 which may be acute or chronic. Electronically Signed   By: David  Martinique M.D.  On: 01/01/2017 08:01   Dg Thoracic Spine 2 View  Result Date: 01/01/2017 CLINICAL DATA:  Right upper back pain following injury yesterday. EXAM: THORACIC SPINE 2 VIEWS COMPARISON:  PA and lateral chest x-ray of today's date. No older comparison views are available. FINDINGS: The thoracic vertebral bodies are preserved in height with exception of T6 where there is mild superior endplate depression. The disc space heights are reasonably well-maintained. The pedicles are intact. There are no abnormal paravertebral soft tissue densities. IMPRESSION: Mild superior endplate depression of T6 may be acute or chronic. There no previous studies available for review. Elsewhere the thoracic spine exhibits no acute or significant chronic abnormality. Electronically Signed   By: David  Martinique M.D.   On: 01/01/2017 08:00   Ct Chest Wo Contrast  Result Date: 01/01/2017 CLINICAL DATA:  Fall with right-sided upper back pain. Thoracic spine radiographs demonstrate mild superior endplate depression at T6 and chest x-ray shows possible subtle nodularity in the left upper lung. Also suspicion of rib fracture. EXAM: CT CHEST WITHOUT CONTRAST TECHNIQUE:  Multidetector CT imaging of the chest was performed following the standard protocol without IV contrast. COMPARISON:  Chest x-ray and thoracic spine x-ray on 01/01/2017 FINDINGS: Cardiovascular: The heart size is normal. No pericardial fluid. The thoracic aorta demonstrates mild calcified plaque without evidence of aneurysm. Minimal calcified plaque in the coronary artery's. Central pulmonary arteries are normal in caliber. Mediastinum/Nodes: No evidence of mediastinal, hilar or axillary lymphadenopathy. Lungs/Pleura: Scattered pulmonary scarring, most prominently at the left lung base. No evidence of pulmonary nodule or mass. No airspace consolidation, pulmonary edema, pneumothorax or pleural fluid identified. Upper Abdomen: No acute abnormality. Musculoskeletal: By CT, the bony thorax shows osteopenia. There is no evidence of acute thoracic fracture or significant compression deformities. Mild degenerative disc disease present. No acute rib fractures identified. No bony lesions. IMPRESSION: 1. No evidence of pulmonary nodule. 2. No evidence of thoracic vertebral fracture or compression deformities. 3. No evidence of acute rib fractures. Electronically Signed   By: Aletta Edouard M.D.   On: 01/01/2017 10:20    Procedures Procedures (including critical care time)  Medications Ordered in ED Medications - No data to display   Initial Impression / Assessment and Plan / ED Course  I have reviewed the triage vital signs and the nursing notes.  Pertinent labs & imaging results that were available during my care of the patient were reviewed by me and considered in my medical decision making (see chart for details).    76 year old female presents with concern for fall yesterday with left-sided rib and back pain.  Pain worse with movements and palpations and suspect MSK etiology.  Did offer screening labs for cardiac etiology given delay in onset of symptoms after fall, however patient declines. She did  agree to EKG which shows no acute abnormalities.  She does not have abdominal tenderness, doubt pancreatitis or splenic injury.  XR of chest and thoracic spine show end plate deformity to T6 and pulmonary nodule.  CT chest done showing no nodule, no sign of fracture of tspine or ribs.  Suspect likely muscular strain related to positioning at time of fall. Recommend ibuprofen, tylenol.   Final Clinical Impressions(s) / ED Diagnoses   Final diagnoses:  Acute left-sided thoracic back pain  Muscle strain    ED Discharge Orders    None       Gareth Morgan, MD 01/01/17 2332

## 2017-01-01 NOTE — ED Triage Notes (Signed)
Patient presents with mid lower back pain s/p fall on yesterday. States she was carrying a bag of leaves and got tripped up and fell. States she used a heat pack and took some ibuprofen. Patient states she wasn't hurting until later last night. Denies hitting head. Did report she skinned up her right knee, however denies knee pain. She reports her glasses bent when she fell and bruised the left side of her eye but denies pain at site or headaches. Patient is A&O x4 and ambulatory.

## 2017-01-01 NOTE — ED Notes (Signed)
[facility] called for TLSO brace

## 2017-01-01 NOTE — ED Notes (Signed)
Pt was carrying some bags of leaves when one bag got caught on something, which caused her to fall on her side (which side she can't recall). She stated she "received a small cut on her left eye from glasses and scraped up her knee, which are both fine." She is c/o mid back pain left of the spine and LUQ abd pain.

## 2017-01-01 NOTE — ED Notes (Signed)
Patient transported to X-ray 

## 2017-01-01 NOTE — ED Notes (Signed)
Patient transported to CT 

## 2017-03-26 ENCOUNTER — Other Ambulatory Visit: Payer: Self-pay | Admitting: Internal Medicine

## 2017-03-26 DIAGNOSIS — Z1231 Encounter for screening mammogram for malignant neoplasm of breast: Secondary | ICD-10-CM

## 2017-04-15 ENCOUNTER — Ambulatory Visit
Admission: RE | Admit: 2017-04-15 | Discharge: 2017-04-15 | Disposition: A | Discharge status: Home / Self Care | Payer: Medicare Other | Source: Ambulatory Visit | Attending: Internal Medicine | Admitting: Internal Medicine

## 2017-04-15 DIAGNOSIS — Z1231 Encounter for screening mammogram for malignant neoplasm of breast: Secondary | ICD-10-CM

## 2018-04-06 ENCOUNTER — Other Ambulatory Visit: Payer: Self-pay | Admitting: Internal Medicine

## 2018-04-06 DIAGNOSIS — Z1231 Encounter for screening mammogram for malignant neoplasm of breast: Secondary | ICD-10-CM

## 2018-05-28 ENCOUNTER — Ambulatory Visit: Payer: Self-pay

## 2018-06-04 ENCOUNTER — Other Ambulatory Visit: Payer: Self-pay

## 2018-06-04 ENCOUNTER — Ambulatory Visit
Admission: RE | Admit: 2018-06-04 | Discharge: 2018-06-04 | Disposition: A | Discharge status: Home / Self Care | Payer: Medicare Other | Source: Ambulatory Visit | Attending: Internal Medicine | Admitting: Internal Medicine

## 2018-06-04 DIAGNOSIS — Z1231 Encounter for screening mammogram for malignant neoplasm of breast: Secondary | ICD-10-CM

## 2018-06-08 ENCOUNTER — Ambulatory Visit: Payer: Self-pay

## 2018-07-20 ENCOUNTER — Ambulatory Visit: Payer: Medicare Other

## 2019-01-11 ENCOUNTER — Ambulatory Visit: Payer: Self-pay | Admitting: *Deleted

## 2019-01-11 NOTE — Telephone Encounter (Signed)
I returned call.   "I don't take any prescriptions".   I'm 79 years old. My sister in law just called and my brother was taken to the hospital and he has COVID-19. I saw brother 8 days ago.  Should I be tested?  I let her know it is a good idea to be tested.  I gave her the information for the test site at the [facility] location to schedule an appt to be tested for COVID-19.    I'm going to stay home from work and complete the 14 days of quarantine just to be safe.    Reason for Disposition . Health Information question, no triage required and triager able to answer question  Answer Assessment - Initial Assessment Questions 1. REASON FOR CALL or QUESTION: "What is your reason for calling today?" or "How can I best help you?" or "What question do you have that I can help answer?"     I'm very healthy.   I don't take any prescriptions.  I was wondering if I needed  To be tested for the COVID-19.  8 days ago I visited my brother.   My sister in law just called me that my brother was taken to the hospital due to extreme weakness.   They diagnosed him with COVID-19.   He was not having symptoms when I was there.    I'm not having symptoms.     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   I didn't know if I needed to quarantine.  Protocols used: INFORMATION ONLY CALL - NO TRIAGE-A-AH

## 2019-01-12 ENCOUNTER — Ambulatory Visit: Payer: Medicare Other | Attending: Internal Medicine

## 2019-01-12 DIAGNOSIS — Z20822 Contact with and (suspected) exposure to covid-19: Secondary | ICD-10-CM

## 2019-01-14 LAB — NOVEL CORONAVIRUS, NAA: SARS-CoV-2, NAA: NOT DETECTED

## 2019-05-05 ENCOUNTER — Other Ambulatory Visit: Payer: Self-pay | Admitting: Internal Medicine

## 2019-05-05 DIAGNOSIS — Z1231 Encounter for screening mammogram for malignant neoplasm of breast: Secondary | ICD-10-CM

## 2019-06-14 ENCOUNTER — Ambulatory Visit
Admission: RE | Admit: 2019-06-14 | Discharge: 2019-06-14 | Disposition: A | Discharge status: Home / Self Care | Payer: Medicare Other | Source: Ambulatory Visit | Attending: Internal Medicine | Admitting: Internal Medicine

## 2019-06-14 ENCOUNTER — Other Ambulatory Visit: Payer: Self-pay

## 2019-06-14 DIAGNOSIS — Z1231 Encounter for screening mammogram for malignant neoplasm of breast: Secondary | ICD-10-CM

## 2019-09-02 ENCOUNTER — Other Ambulatory Visit: Payer: Self-pay | Admitting: Internal Medicine

## 2019-09-02 DIAGNOSIS — F17201 Nicotine dependence, unspecified, in remission: Secondary | ICD-10-CM

## 2019-09-20 ENCOUNTER — Other Ambulatory Visit: Payer: Self-pay

## 2019-09-20 ENCOUNTER — Ambulatory Visit
Admission: RE | Admit: 2019-09-20 | Discharge: 2019-09-20 | Disposition: A | Discharge status: Home / Self Care | Payer: Medicare Other | Source: Ambulatory Visit | Attending: Internal Medicine | Admitting: Internal Medicine

## 2019-09-20 DIAGNOSIS — F17201 Nicotine dependence, unspecified, in remission: Secondary | ICD-10-CM

## 2020-05-02 ENCOUNTER — Other Ambulatory Visit: Payer: Self-pay | Admitting: Internal Medicine

## 2020-05-02 DIAGNOSIS — Z1231 Encounter for screening mammogram for malignant neoplasm of breast: Secondary | ICD-10-CM

## 2020-06-26 ENCOUNTER — Ambulatory Visit
Admission: RE | Admit: 2020-06-26 | Discharge: 2020-06-26 | Disposition: A | Discharge status: Home / Self Care | Payer: Medicare Other | Source: Ambulatory Visit | Attending: Internal Medicine | Admitting: Internal Medicine

## 2020-06-26 ENCOUNTER — Other Ambulatory Visit: Payer: Self-pay

## 2020-06-26 DIAGNOSIS — Z1231 Encounter for screening mammogram for malignant neoplasm of breast: Secondary | ICD-10-CM

## 2020-12-01 ENCOUNTER — Other Ambulatory Visit: Payer: Self-pay

## 2020-12-01 ENCOUNTER — Ambulatory Visit (HOSPITAL_COMMUNITY)
Admission: EM | Admit: 2020-12-01 | Discharge: 2020-12-01 | Discharge status: Absent without leave | Payer: Medicare Other

## 2020-12-01 ENCOUNTER — Emergency Department (HOSPITAL_COMMUNITY): Payer: Medicare Other

## 2020-12-01 ENCOUNTER — Encounter (HOSPITAL_COMMUNITY): Payer: Self-pay

## 2020-12-01 ENCOUNTER — Observation Stay (HOSPITAL_COMMUNITY)
Admission: EM | Admit: 2020-12-01 | Discharge: 2020-12-03 | Disposition: A | Discharge status: Home / Self Care | Payer: Medicare Other | Attending: Emergency Medicine | Admitting: Emergency Medicine

## 2020-12-01 DIAGNOSIS — W19XXXA Unspecified fall, initial encounter: Secondary | ICD-10-CM

## 2020-12-01 DIAGNOSIS — S32511A Fracture of superior rim of right pubis, initial encounter for closed fracture: Principal | ICD-10-CM | POA: Diagnosis present

## 2020-12-01 DIAGNOSIS — Z9181 History of falling: Secondary | ICD-10-CM | POA: Diagnosis not present

## 2020-12-01 DIAGNOSIS — Y92009 Unspecified place in unspecified non-institutional (private) residence as the place of occurrence of the external cause: Secondary | ICD-10-CM | POA: Insufficient documentation

## 2020-12-01 DIAGNOSIS — Z20822 Contact with and (suspected) exposure to covid-19: Secondary | ICD-10-CM | POA: Diagnosis not present

## 2020-12-01 DIAGNOSIS — Z79899 Other long term (current) drug therapy: Secondary | ICD-10-CM | POA: Diagnosis not present

## 2020-12-01 DIAGNOSIS — S79911A Unspecified injury of right hip, initial encounter: Secondary | ICD-10-CM | POA: Diagnosis present

## 2020-12-01 DIAGNOSIS — S32591A Other specified fracture of right pubis, initial encounter for closed fracture: Secondary | ICD-10-CM

## 2020-12-01 DIAGNOSIS — W1839XA Other fall on same level, initial encounter: Secondary | ICD-10-CM | POA: Diagnosis not present

## 2020-12-01 DIAGNOSIS — R2689 Other abnormalities of gait and mobility: Secondary | ICD-10-CM | POA: Insufficient documentation

## 2020-12-01 DIAGNOSIS — Z87891 Personal history of nicotine dependence: Secondary | ICD-10-CM | POA: Diagnosis not present

## 2020-12-01 DIAGNOSIS — S72001A Fracture of unspecified part of neck of right femur, initial encounter for closed fracture: Secondary | ICD-10-CM

## 2020-12-01 DIAGNOSIS — Z85828 Personal history of other malignant neoplasm of skin: Secondary | ICD-10-CM | POA: Diagnosis not present

## 2020-12-01 LAB — CBC WITH DIFFERENTIAL/PLATELET
Abs Immature Granulocytes: 0.04 10*3/uL (ref 0.00–0.07)
Basophils Absolute: 0 10*3/uL (ref 0.0–0.1)
Basophils Relative: 0 %
Eosinophils Absolute: 0 10*3/uL (ref 0.0–0.5)
Eosinophils Relative: 0 %
HCT: 45.9 % (ref 36.0–46.0)
Hemoglobin: 15.7 g/dL — ABNORMAL HIGH (ref 12.0–15.0)
Immature Granulocytes: 0 %
Lymphocytes Relative: 11 %
Lymphs Abs: 1.2 10*3/uL (ref 0.7–4.0)
MCH: 31.8 pg (ref 26.0–34.0)
MCHC: 34.2 g/dL (ref 30.0–36.0)
MCV: 92.9 fL (ref 80.0–100.0)
Monocytes Absolute: 1.2 10*3/uL — ABNORMAL HIGH (ref 0.1–1.0)
Monocytes Relative: 11 %
Neutro Abs: 8.8 10*3/uL — ABNORMAL HIGH (ref 1.7–7.7)
Neutrophils Relative %: 78 %
Platelets: 249 10*3/uL (ref 150–400)
RBC: 4.94 MIL/uL (ref 3.87–5.11)
RDW: 13.1 % (ref 11.5–15.5)
WBC: 11.4 10*3/uL — ABNORMAL HIGH (ref 4.0–10.5)
nRBC: 0 % (ref 0.0–0.2)

## 2020-12-01 LAB — COMPREHENSIVE METABOLIC PANEL
ALT: 20 U/L (ref 0–44)
AST: 25 U/L (ref 15–41)
Albumin: 4.2 g/dL (ref 3.5–5.0)
Alkaline Phosphatase: 67 U/L (ref 38–126)
Anion gap: 10 (ref 5–15)
BUN: 11 mg/dL (ref 8–23)
CO2: 23 mmol/L (ref 22–32)
Calcium: 9.2 mg/dL (ref 8.9–10.3)
Chloride: 105 mmol/L (ref 98–111)
Creatinine, Ser: 0.93 mg/dL (ref 0.44–1.00)
GFR, Estimated: >60 mL/min (ref >60)
Glucose, Bld: 121 mg/dL — ABNORMAL HIGH (ref 70–99)
Potassium: 3.5 mmol/L (ref 3.5–5.1)
Sodium: 138 mmol/L (ref 135–145)
Total Bilirubin: 1.1 mg/dL (ref 0.3–1.2)
Total Protein: 6.8 g/dL (ref 6.5–8.1)

## 2020-12-01 LAB — RESP PANEL BY RT-PCR (FLU A&B, COVID) ARPGX2
Influenza A by PCR: NEGATIVE
Influenza B by PCR: NEGATIVE
SARS Coronavirus 2 by RT PCR: NEGATIVE

## 2020-12-01 MED ORDER — HYDROCODONE-ACETAMINOPHEN 5-325 MG PO TABS
1.0000 | ORAL_TABLET | Freq: Once | ORAL | Status: AC
  Administered 2020-12-01: 1 via ORAL
  Filled 2020-12-01: qty 1

## 2020-12-01 MED ORDER — ACETAMINOPHEN 325 MG PO TABS
325.0000 mg | ORAL_TABLET | Freq: Once | ORAL | Status: AC
  Administered 2020-12-01: 325 mg via ORAL
  Filled 2020-12-01: qty 1

## 2020-12-01 NOTE — ED Notes (Signed)
Pt given [place] sandwich per Karle Starch MD

## 2020-12-01 NOTE — ED Provider Notes (Addendum)
[facility] EMERGENCY DEPARTMENT Provider Note   CSN: [id number] Arrival date & time: 12/01/20  1736     History Chief Complaint  Patient presents with   Lytle Michaels    Carmen Rose is a 80 y.o. female.  HPI Patient presents to the emergency department due to a fall that occurred prior to arrival.  Patient states she was walking 2 small dogs and there are lesions wrapped around her legs and caused her to fall backwards landing on her buttocks.  Patient reports moderate pain to the right buttock.  Pain is nonradiating.  No numbness, tingling, or weakness.  Patient denies any head trauma.  Denies any headaches, visual changes, neck pain, back pain, chest pain, abdominal pain, shortness of breath.  Patient denies being anticoagulated.    Past Medical History:  Diagnosis Date   Anemia    50 yrs ago   Cancer [facility])    skin cancer right arm   Heart murmur    prolapsed mitral valve   Osteoporosis     There are no problems to display for this patient.   Past Surgical History:  Procedure Laterality Date   ADENOIDECTOMY     COLONOSCOPY     POLYPECTOMY     right arm fracture     humerus   TONSILLECTOMY AND ADENOIDECTOMY       OB History   No obstetric history on file.     Family History  Problem Relation Age of Onset   Cervical cancer Mother    Heart disease Mother    Lung cancer Father    Colon cancer Neg Hx    Rectal cancer Neg Hx    Stomach cancer Neg Hx     Social History   Tobacco Use   Smoking status: Former   Smokeless tobacco: Never  Substance Use Topics   Alcohol use: Yes    Comment: occasional   Drug use: No    Home Medications Prior to Admission medications   Medication Sig Start Date End Date Taking? Authorizing Provider  acetaminophen (TYLENOL) 500 MG tablet Take 250 mg by mouth every 6 (six) hours as needed for moderate pain or headache.   Yes [provider]  Biotin 1000 MCG tablet Take 1,000 mcg by mouth at  bedtime.   Yes [provider]  Calcium Carb-Cholecalciferol 600-800 MG-UNIT TABS Take 1 capsule by mouth 2 (two) times daily before a meal.   Yes [provider]  Cholecalciferol (VITAMIN D-3 PO) Take 1,000 Units by mouth at bedtime.   Yes [provider]  Multiple Vitamins-Minerals (MULTIVITAMIN PO) Take 1 capsule by mouth daily.   Yes [provider]  rosuvastatin (CRESTOR) 10 MG tablet Take 10 mg by mouth every Sunday. 11/27/20  Yes [provider]    Allergies    Alendronate, Lipitor [atorvastatin], Milk-related compounds, Other, Sudafed [pseudoephedrine hcl], Adhesive [tape], and Caladryl [pramoxine-calamine]  Review of Systems   Review of Systems  All other systems reviewed and are negative. Ten systems reviewed and are negative for acute change, except as noted in the HPI.   Physical Exam Updated Vital Signs BP (!) 149/67   Pulse 87   Temp 98.8 F (37.1 C) (Oral)   Resp 17   Ht 5\' 7"  (1.702 m)   Wt 58.5 kg   SpO2 94%   BMI 20.20 kg/m   Physical Exam Vitals and nursing note reviewed.  Constitutional:      General: She is not in acute  distress.    Appearance: Normal appearance. She is not ill-appearing, toxic-appearing or diaphoretic.  HENT:     Head: Normocephalic and atraumatic.     Comments: No visible or palpable signs of head trauma.  No crepitus.    Right Ear: Tympanic membrane, ear canal and external ear normal. There is no impacted cerumen.     Left Ear: Tympanic membrane, ear canal and external ear normal. There is no impacted cerumen.     Ears:     Comments: No hemotympanums.    Nose: Nose normal.     Mouth/Throat:     Mouth: Mucous membranes are moist.     Pharynx: Oropharynx is clear. No oropharyngeal exudate or posterior oropharyngeal erythema.  Eyes:     General: No scleral icterus.       Right eye: No discharge.        Left eye: No discharge.     Extraocular Movements: Extraocular movements intact.      Conjunctiva/sclera: Conjunctivae normal.     Pupils: Pupils are equal, round, and reactive to light.  Neck:     Comments: No midline C, T, or L-spine tenderness. Cardiovascular:     Rate and Rhythm: Normal rate and regular rhythm.     Pulses: Normal pulses.     Heart sounds: Normal heart sounds. No murmur heard.   No friction rub. No gallop.  Pulmonary:     Effort: Pulmonary effort is normal. No respiratory distress.     Breath sounds: Normal breath sounds. No stridor. No wheezing, rhonchi or rales.  Abdominal:     General: Abdomen is flat.     Tenderness: There is no abdominal tenderness.  Musculoskeletal:        General: Tenderness present. Normal range of motion.     Cervical back: Normal range of motion and neck supple. No tenderness.     Comments: Moderate tenderness noted along the right posterior hip.  No right lateral hip pain or anterior hip pain.  Full range of motion of the right hip, knee, and ankle.  Strength is 5 out of 5 in the bilateral lower extremities.  Distal sensation intact.  2+ pedal pulses.  Skin:    General: Skin is warm and dry.  Neurological:     General: No focal deficit present.     Mental Status: She is alert and oriented to person, place, and time.     Comments: Patient is oriented to person, place, and time. Patient phonates in clear, complete, and coherent sentences. Strength is 5/5 in all four extremities. Distal sensation intact in all four extremities.  Psychiatric:        Mood and Affect: Mood normal.        Behavior: Behavior normal.   ED Results / Procedures / Treatments   Labs (all labs ordered are listed, but only abnormal results are displayed) Labs Reviewed  COMPREHENSIVE METABOLIC PANEL - Abnormal; Notable for the following components:      Result Value   Glucose, Bld 121 (*)    All other components within normal limits  CBC WITH DIFFERENTIAL/PLATELET - Abnormal; Notable for the following components:   WBC 11.4 (*)    Hemoglobin 15.7  (*)    Neutro Abs 8.8 (*)    Monocytes Absolute 1.2 (*)    All other components within normal limits  RESP PANEL BY RT-PCR (FLU A&B, COVID) ARPGX2   EKG None  Radiology DG Hip Unilat W or Wo Pelvis 2-3 Views Right  Result Date: 12/01/2020 CLINICAL DATA:  Fall and right hip pain. EXAM: DG HIP (WITH OR WITHOUT PELVIS) 2-3V RIGHT COMPARISON:  None FINDINGS: Nondisplaced fracture of the right superior pubic ramus. No other acute fracture identified. There is no dislocation. The bones are osteopenic. The soft tissues are unremarkable. Degenerative changes of the lower lumbar spine. IMPRESSION: Nondisplaced fracture of the right superior pubic ramus. Electronically Signed   By: Anner Crete M.D.   On: 12/01/2020 21:18    Procedures Procedures   Medications Ordered in ED Medications  acetaminophen (TYLENOL) tablet 325 mg (325 mg Oral Given 12/01/20 1952)  HYDROcodone-acetaminophen (NORCO/VICODIN) 5-325 MG per tablet 1 tablet (1 tablet Oral Given 12/01/20 2330)   ED Course  I have reviewed the triage vital signs and the nursing notes.  Pertinent labs & imaging results that were available during my care of the patient were reviewed by me and considered in my medical decision making (see chart for details).  Clinical Course as of 12/01/20 2338  Fri Dec 01, 2020  2208 Patient discussed with Dr. Stann Mainland who is on-call for orthopedics.  Agrees with admission.  Recommends PT evaluation during the admission.  Patient can remain full weightbearing. [LJ]    Clinical Course User Index [LJ] Rayna Sexton, PA-C   MDM Rules/Calculators/A&P                          Pt is a 80 y.o. female who presents to the emergency department due to a fall that occurred prior to arrival.  Imaging: X-ray obtained of the right hip which shows a nondisplaced fracture of the right superior pubic rami.  I, Rayna Sexton, PA-C, personally reviewed and evaluated these images and lab results as part of my  medical decision-making.  Patient lives alone and has no one to take care of her at home.  We have attempted to ambulate the patient unsuccessfully due to her pain.  X-ray with findings as noted above.  Patient neurovascularly intact in the bilateral lower extremities.  Feel that patient would likely benefit from admission for pain management as well as PT evaluation.  This was discussed with the patient and she is amenable.  We will obtain screening labs as well as a respiratory panel.  We will discuss with the medicine team.  Patient initially refused the Norco because she felt that her pain was well controlled while lying in bed.  Her pain worsens significantly when bearing any weight and patient states that she cannot ambulate due to her pain.  Patient is going to take a dose of Norco and reattempt ambulation.  Medicine recommends that we reconsult them if patient is still having difficulty with ambulation due to her pain.   Patient care transferred to [facility].  Patient will require another trial of ambulation after receiving Norco.  If she fails again she will require admission to medicine for pain management as well as PT evaluation.  Note: Portions of this report may have been transcribed using voice recognition software. Every effort was made to ensure accuracy; however, inadvertent computerized transcription errors may be present.   Final Clinical Impression(s) / ED Diagnoses Final diagnoses:  Fall, initial encounter  Closed fracture of right hip, initial encounter [facility])   Rx / DC Orders ED Discharge Orders     None        Rayna Sexton, PA-C 12/01/20 2314    Rayna Sexton, PA-C 12/01/20 2341    Rayna Sexton,  PA-C 12/01/20 2343    Truddie Hidden, MD 12/05/20 [phone]

## 2020-12-01 NOTE — ED Provider Notes (Signed)
Emergency Medicine Provider Triage Evaluation Note  Carmen Rose , a 80 y.o. female  was evaluated in triage.  Pt complains of right hip and buttock pain today after fall.  Patient states that she was walking a friend's dogs when they got her tangled up in her leashes and she fell hard onto her buttocks and catching herself on her right hand behind her.  She denies any hand pain but endorses severe hip and buttock pain that makes her unable to walk.  Review of Systems  Positive: Right hip pain Negative: Numbness, tingling or weakness in her leg  Physical Exam  BP (!) 157/96 (BP Location: Right Arm)   Pulse 85   Temp 98.8 F (37.1 C) (Oral)   Resp 16   Ht 5\' 7"  (1.702 m)   Wt 58.5 kg   SpO2 93%   BMI 20.20 kg/m  Gen:   Awake, no distress   Resp:  Normal effort  MSK:   Moves extremities without difficulty  Other:  Tenderness to palpation over the right greater trochanter as well as the right PSIS.  Mild abrasion over right palm without laceration or scaphoid tenderness palpation  Medical Decision Making  Medically screening exam initiated at 6:40 PM.  Appropriate orders placed.  Iven Finn was informed that the remainder of the evaluation will be completed by another provider, this initial triage assessment does not replace that evaluation, and the importance of remaining in the ED until their evaluation is complete.  Patient requesting tylenol dosage of <500 mg.   This chart was dictated using voice recognition software, Dragon. Despite the best efforts of this provider to proofread and correct errors, errors may still occur which can change documentation meaning.    Aura Dials 12/01/20 Ellin Mayhew, MD 12/01/20 2028

## 2020-12-01 NOTE — ED Notes (Signed)
Patient transported to X-ray 

## 2020-12-01 NOTE — Consult Note (Signed)
ORTHOPAEDIC CONSULTATION  REQUESTING PHYSICIAN: Truddie Hidden, MD  PCP:  Ginger Organ., MD  Chief Complaint: Right hip pain  HPI: Carmen Rose is a 80 y.o. female who complains of right hip pain following a ground-level fall prior to arrival.  She does live independently.  She lives alone.  She states she lost her footing and fell on her backside.  She also landed on her right hand.  She was having difficulty bearing weight and called EMS.  She states that she is independent with ADLs.  Denies any ambulatory assistive devices.  Denies smoking or diabetes.  No history of previous trauma to the hip or pelvic ring.  Past Medical History:  Diagnosis Date   Anemia    50 yrs ago   Cancer ([place])    skin cancer right arm   Heart murmur    prolapsed mitral valve   Osteoporosis    Past Surgical History:  Procedure Laterality Date   ADENOIDECTOMY     COLONOSCOPY     POLYPECTOMY     right arm fracture     humerus   TONSILLECTOMY AND ADENOIDECTOMY     Social History   Socioeconomic History   Marital status: Single    Spouse name: Not on file   Number of children: Not on file   Years of education: Not on file   Highest education level: Not on file  Occupational History   Not on file  Tobacco Use   Smoking status: Former   Smokeless tobacco: Never  Vaping Use   Vaping Use: Not on file  Substance and Sexual Activity   Alcohol use: Yes    Comment: occasional   Drug use: No   Sexual activity: Not on file  Other Topics Concern   Not on file  Social History Narrative   Not on file   Social Determinants of Health   Financial Resource Strain: Not on file  Food Insecurity: Not on file  Transportation Needs: Not on file  Physical Activity: Not on file  Stress: Not on file  Social Connections: Not on file   Family History  Problem Relation Age of Onset   Cervical cancer Mother    Heart disease Mother    Lung cancer Father    Colon cancer Neg Hx     Rectal cancer Neg Hx    Stomach cancer Neg Hx    Allergies  Allergen Reactions   Alendronate     Flush face    Lipitor [Atorvastatin]     Extreme exhaustion   Milk-Related Compounds Diarrhea    Whole milk   Other Other (See Comments)    "pain killers" (constipation) Vitamin C(constipation) Anti inflammatory eye drops(swollen throat and vocal cords   Sudafed [Pseudoephedrine Hcl] Other (See Comments)    Vertigo and sleep problems   Adhesive [Tape] Rash   Caladryl [Pramoxine-Calamine] Rash    Contact dermatitis    Prior to Admission medications   Medication Sig Start Date End Date Taking? Authorizing Provider  acetaminophen (TYLENOL) 500 MG tablet Take 250 mg by mouth every 6 (six) hours as needed for moderate pain or headache.   Yes [provider]  Biotin 1000 MCG tablet Take 1,000 mcg by mouth at bedtime.   Yes [provider]  Calcium Carb-Cholecalciferol 600-800 MG-UNIT TABS Take 1 capsule by mouth 2 (two) times daily before a meal.   Yes [provider]  Cholecalciferol (VITAMIN D-3 PO) Take 1,000 Units by mouth  at bedtime.   Yes [provider]  Multiple Vitamins-Minerals (MULTIVITAMIN PO) Take 1 capsule by mouth daily.   Yes [provider]  rosuvastatin (CRESTOR) 10 MG tablet Take 10 mg by mouth every Sunday. 11/27/20  Yes [provider]   DG Hip Unilat W or Wo Pelvis 2-3 Views Right  Result Date: 12/01/2020 CLINICAL DATA:  Fall and right hip pain. EXAM: DG HIP (WITH OR WITHOUT PELVIS) 2-3V RIGHT COMPARISON:  None FINDINGS: Nondisplaced fracture of the right superior pubic ramus. No other acute fracture identified. There is no dislocation. The bones are osteopenic. The soft tissues are unremarkable. Degenerative changes of the lower lumbar spine. IMPRESSION: Nondisplaced fracture of the right superior pubic ramus. Electronically Signed   By: Anner Crete M.D.   On: 12/01/2020 21:18    Positive ROS: All other systems  have been reviewed and were otherwise negative with the exception of those mentioned in the HPI and as above.  Physical Exam: General: Alert, no acute distress Cardiovascular: No pedal edema Respiratory: No cyanosis, no use of accessory musculature GI: No organomegaly, abdomen is soft and non-tender Skin: No lesions in the area of chief complaint Neurologic: Sensation intact distally Psychiatric: Patient is competent for consent with normal mood and affect Lymphatic: No axillary or cervical lymphadenopathy  MUSCULOSKELETAL:  Bony pelvis exam:  She has no pain on the left lower extremity with logroll.  Mild groin pain noted on the right with logroll.  She has a stable pelvis to lateral and AP compression.  She has have some pain on the right side with lateral compression otherwise she is neurovascular intact bilateral lower extremities.  Assessment: LC type I pelvic ring fracture on the right side with superior and inferior nondisplaced pubic rami fractures.  Plan: -At this time no recommendation for orthopedic surgical intervention.  These we managed in a conservative fashion.  She can be weightbearing as tolerated to bilateral lower extremities.  -Appreciate medicine admission for pain management.  Also would recommend evaluation treatment from physical therapy to help with gait and assistive devices if needed.  -She can follow-up with me in the office in 2 weeks postdischarge.  We will sign off at this time.  Please call with questions.    Nicholes Stairs, MD Cell [phone]    12/01/2020 10:21 PM

## 2020-12-01 NOTE — ED Triage Notes (Signed)
Pt at home walking dogs, lost footing, fell on her bottom and also landed on her right hand. Pt now stating it's too painful to walk on right leg. Pt has no c/o numbness or tingling, but states it feels like she's sitting on a bruise.

## 2020-12-02 ENCOUNTER — Encounter (HOSPITAL_COMMUNITY): Payer: Self-pay | Admitting: Internal Medicine

## 2020-12-02 DIAGNOSIS — S32511A Fracture of superior rim of right pubis, initial encounter for closed fracture: Secondary | ICD-10-CM

## 2020-12-02 MED ORDER — ACETAMINOPHEN 650 MG RE SUPP: 650.0000 mg | Freq: Four times a day (QID) | RECTAL | Status: DC | PRN

## 2020-12-02 MED ORDER — ENOXAPARIN SODIUM 40 MG/0.4ML IJ SOSY
40.0000 mg | PREFILLED_SYRINGE | INTRAMUSCULAR | Status: DC
  Administered 2020-12-02: 40 mg via SUBCUTANEOUS
  Filled 2020-12-02: qty 0.4

## 2020-12-02 MED ORDER — SENNOSIDES-DOCUSATE SODIUM 8.6-50 MG PO TABS: 1.0000 | ORAL_TABLET | Freq: Every evening | ORAL | Status: DC | PRN

## 2020-12-02 MED ORDER — MORPHINE SULFATE (PF) 2 MG/ML IV SOLN: 1.0000 mg | INTRAVENOUS | Status: DC | PRN

## 2020-12-02 MED ORDER — ROSUVASTATIN CALCIUM 5 MG PO TABS
10.0000 mg | ORAL_TABLET | ORAL | Status: DC
  Administered 2020-12-03: 09:00:00 10 mg via ORAL
  Filled 2020-12-02: qty 2

## 2020-12-02 MED ORDER — ONDANSETRON HCL 4 MG PO TABS: 4.0000 mg | ORAL_TABLET | Freq: Four times a day (QID) | ORAL | Status: DC | PRN

## 2020-12-02 MED ORDER — OXYCODONE HCL 5 MG PO TABS
5.0000 mg | ORAL_TABLET | ORAL | Status: DC | PRN
  Administered 2020-12-02 (×3): 5 mg via ORAL
  Filled 2020-12-02 (×3): qty 1

## 2020-12-02 MED ORDER — NAPROXEN 250 MG PO TABS
375.0000 mg | ORAL_TABLET | Freq: Two times a day (BID) | ORAL | Status: DC
  Administered 2020-12-02 – 2020-12-03 (×3): 375 mg via ORAL
  Filled 2020-12-02 (×3): qty 2

## 2020-12-02 MED ORDER — ACETAMINOPHEN 500 MG PO TABS: 1000.0000 mg | ORAL_TABLET | Freq: Four times a day (QID) | ORAL | Status: DC | PRN

## 2020-12-02 MED ORDER — ONDANSETRON HCL 4 MG/2ML IJ SOLN
4.0000 mg | Freq: Four times a day (QID) | INTRAMUSCULAR | Status: DC | PRN
  Filled 2020-12-02: qty 2

## 2020-12-02 NOTE — Evaluation (Signed)
Physical Therapy Evaluation Patient Details Name: Carmen Rose MRN: [id number] DOB: 08/08/1940 Today's Date: 12/02/2020  History of Present Illness  Pt. is 80 yr old F admitted on 12/01/20 with c/o being unable to walk on R LE after falling while walking dogs.  Imaging (+) for nondisplaced fx of R superior pubic ramus. PMH: anemia, skin CA, heart murmur, osteoporsis.  Clinical Impression  Pt was previously living alone and independent prior to fall resulting in R pubic fx.  Pt currently performing bed mobility and sit > stand with mod independence with use of RW and amb with supervision/VC's only for appropriate DME use.  Pt demos dec activity tolerance primarily due to pain and nausea and would benefit from skilled PT in acute care to address deficits indicated in eval and work on stair training.  Pt is adamant about returning home, stating she will have necessary level of assist to function.  Pt would benefit from [facility] PT f/u secondary to living alone.     Recommendations for follow up therapy are one component of a multi-disciplinary discharge planning process, led by the attending physician.  Recommendations may be updated based on patient status, additional functional criteria and insurance authorization.  Follow Up Recommendations Home health PT    Assistance Recommended at Discharge PRN  Functional Status Assessment Patient has had a recent decline in their functional status and demonstrates the ability to make significant improvements in function in a reasonable and predictable amount of time.  Equipment Recommendations  Rolling walker (2 wheels);BSC/3in1 (States she will borrow 2WW from brother)    Recommendations for Other Services       Precautions / Restrictions Precautions Precautions: None Restrictions Weight Bearing Restrictions: Yes RLE Weight Bearing: Weight bearing as tolerated      Mobility  Bed Mobility Overal bed mobility: Modified Independent              General bed mobility comments: increase time due to pain Patient Response: Cooperative  Transfers Overall transfer level: Modified independent Equipment used: Rolling walker (2 wheels) Transfers: Sit to/from Stand Sit to Stand: Modified independent (Device/Increase time)           General transfer comment: VCs to push up from bed for safety and to reach back for bed for eccentric control    Ambulation/Gait Ambulation/Gait assistance: Supervision Gait Distance (Feet): 15 Feet Assistive device: Rolling walker (2 wheels) Gait Pattern/deviations: Step-to pattern;Decreased step length - right;Decreased step length - left       General Gait Details: Pt. amb with slow cadence short distance in room.  Distance limited by pain and nausea.  According to pt, has already been vomiting this AM.  Pt. requires reeducation for appropriate sequencing and AD use.  Pt. able to demo steps backwards to bed safely without LOB.  Stairs Stairs:  (Verbal education on how to negotiate steps, demos understanding.  Unable to practice today due to inc nausea with short distance amb.)          Wheelchair Mobility    Modified Rankin (Stroke Patients Only)       Balance Overall balance assessment: Modified Independent Sitting-balance support: No upper extremity supported Sitting balance-Leahy Scale: Good     Standing balance support: Bilateral upper extremity supported;During functional activity Standing balance-Leahy Scale: Fair                               Pertinent Vitals/Pain Pain Assessment: 0-10 Pain  Score: 7  Pain Location: R LE Pain Descriptors / Indicators: Aching;Throbbing;Discomfort Pain Intervention(s): Monitored during session;Limited activity within patient's tolerance    Home Living Family/patient expects to be discharged to:: Private residence Living Arrangements: Alone Available Help at Discharge: Friend(s) (States she has friends that can stop by  1x/day) Type of Home: House Home Access: Stairs to enter Entrance Stairs-Rails: None Entrance Stairs-Number of Steps: 2   Home Layout: One level Home Equipment:  (Going to borrow 2WW from brother)      Prior Function Prior Level of Function : Independent/Modified Independent;Working/employed             Mobility Comments: Independent       Hand Dominance        Extremity/Trunk Assessment   Upper Extremity Assessment Upper Extremity Assessment: Defer to OT evaluation RUE Deficits / Details: WFL for ADLS but had a fall in the past and due to this limited ROM RUE Sensation: WNL RUE Coordination: WNL    Lower Extremity Assessment Lower Extremity Assessment: Overall WFL for tasks assessed    Cervical / Trunk Assessment Cervical / Trunk Assessment: Normal  Communication   Communication: No difficulties  Cognition Arousal/Alertness: Awake/alert Behavior During Therapy: WFL for tasks assessed/performed Overall Cognitive Status: Within Functional Limits for tasks assessed                                 General Comments: Alert and oriented x 3.  Agreeable to work with PT but c/o fatigue and inc pain from earlier today.  States she was brought to floor at 4 am, already worked with OT and just got BTB.        General Comments General comments (skin integrity, edema, etc.): Pt reports that preference is to return home, states that MD told her she could stay until tomorrow.    Exercises     Assessment/Plan    PT Assessment Patient needs continued PT services  PT Problem List Decreased strength;Decreased mobility;Decreased safety awareness;Decreased activity tolerance;Decreased balance;Decreased knowledge of use of DME;Pain       PT Treatment Interventions DME instruction;Therapeutic exercise;Gait training;Balance training;Stair training;Functional mobility training;Therapeutic activities;Patient/family education    PT Goals (Current goals can be  found in the Care Plan section)  Acute Rehab PT Goals Patient Stated Goal: Pt's goal is to return to independence/work PT Goal Formulation: With patient Time For Goal Achievement: 12/16/20 Potential to Achieve Goals: Good    Frequency Min 5X/week   Barriers to discharge Decreased caregiver support;Inaccessible home environment Needs to be able to negotiate 2 steps to get in house    Co-evaluation               AM-PAC PT "6 Clicks" Mobility  Outcome Measure Help needed turning from your back to your side while in a flat bed without using bedrails?: None Help needed moving from lying on your back to sitting on the side of a flat bed without using bedrails?: None Help needed moving to and from a bed to a chair (including a wheelchair)?: A Little Help needed standing up from a chair using your arms (e.g., wheelchair or bedside chair)?: A Little Help needed to walk in hospital room?: A Little Help needed climbing 3-5 steps with a railing? : A Little 6 Click Score: 20    End of Session Equipment Utilized During Treatment: Gait belt Activity Tolerance: Patient tolerated treatment well;Patient limited by pain;Other (comment) (Limited  by nausea) Patient left: in bed;with bed alarm set;with call bell/phone within reach;with nursing/sitter in room Nurse Communication: Mobility status PT Visit Diagnosis: Other abnormalities of gait and mobility (R26.89);History of falling (Z91.81);Pain Pain - Right/Left: Right Pain - part of body: Hip    Time: 2202-5427 PT Time Calculation (min) (ACUTE ONLY): 10 min   Charges:   PT Evaluation $PT Eval Low Complexity: 1 Low         Denya Buckingham A. Jomo Forand, PT, DPT Acute Rehabilitation Services Office: [place] 12/02/2020, 10:08 AM

## 2020-12-02 NOTE — Evaluation (Signed)
Occupational Therapy Evaluation Patient Details Name: Carmen Rose MRN: [id number] DOB: 03/04/1940 Today's Date: 12/02/2020   History of Present Illness Pt is a 80 yr old female who presented due to fall as was dog sitting and tripped over dog. Pt found to have R hip pain due to pelvic ring fracture on the right side with superior and inferior nondisplaced pubic rami fractures. PMH but not limited to: RUE fx, anemai, hear murmur,  mitral valve prolapse   Clinical Impression   Pt at PLOF was living alone and works part time with no assisted devices. Pt at this timed was able to complete bed mobility with modI, sit to stand transfers with supervision, functional ambulation with FW with min guard, LB dressing with min guard. Pt at PLOF often would take "bird baths" with wipes. Pt reports they can have at least one friend or family member to assist daily and has frozen meals to prepare. Pt was limited in session as pt reports they will start to vomit with any increase in pain levels and occurred in treatment. Pt discussed therapy options and would like to return home if possible.  Pt currently with functional limitations due to the deficits listed below (see OT Problem List).  Pt will benefit from skilled OT to increase their safety and independence with ADL and functional mobility for ADL to facilitate discharge to venue listed below.        Recommendations for follow up therapy are one component of a multi-disciplinary discharge planning process, led by the attending physician.  Recommendations may be updated based on patient status, additional functional criteria and insurance authorization.   Follow Up Recommendations  Home health OT (Pt would like to go home but if they go home to have an aide with therapies)    Assistance Recommended at Discharge Intermittent Supervision/Assistance  Functional Status Assessment  Patient has had a recent decline in their functional status and  demonstrates the ability to make significant improvements in function in a reasonable and predictable amount of time.  Equipment Recommendations  BSC/3in1 (FW, reacher, walker tray)    Recommendations for Other Services       Precautions / Restrictions Precautions Precautions: Fall Restrictions Weight Bearing Restrictions: Yes RLE Weight Bearing: Weight bearing as tolerated      Mobility Bed Mobility Overal bed mobility: Modified Independent             General bed mobility comments: increase time due to pain    Transfers Overall transfer level: Needs assistance Equipment used: Rolling walker (2 wheels) Transfers: Sit to/from Stand Sit to Stand: Supervision                  Balance Overall balance assessment: Needs assistance Sitting-balance support: No upper extremity supported Sitting balance-Leahy Scale: Good     Standing balance support: Bilateral upper extremity supported;During functional activity Standing balance-Leahy Scale: Fair                             ADL either performed or assessed with clinical judgement   ADL Overall ADL's : Needs assistance/impaired Eating/Feeding: Independent;Sitting   Grooming: Wash/dry hands;Wash/dry face;Min guard;Cueing for safety;Cueing for sequencing;Sitting;Standing   Upper Body Bathing: Set up;Cueing for safety;Cueing for sequencing;Sitting   Lower Body Bathing: Min guard;Cueing for sequencing;Sit to/from stand;Cueing for safety   Upper Body Dressing : Sitting;Set up   Lower Body Dressing: Min guard;Cueing for safety;Cueing for sequencing;Sit to/from stand  Toilet Transfer: Cueing for safety;Cueing for sequencing;Min guard;BSC/3in1;Regular Toilet;Rolling walker (2 wheels)   Toileting- Clothing Manipulation and Hygiene: Min guard;Cueing for safety;Cueing for sequencing;Sit to/from stand       Functional mobility during ADLs: Min guard;Rolling walker (2 wheels)       Vision Baseline  Vision/History: 1 Wears glasses Ability to See in Adequate Light: 0 Adequate Patient Visual Report: No change from baseline       Perception     Praxis      Pertinent Vitals/Pain Pain Assessment: 0-10 Pain Score: 4  Pain Location: R groin Pain Descriptors / Indicators: Aching;Constant;Discomfort;Dull;Grimacing;Guarding Pain Intervention(s): Limited activity within patient's tolerance (However, pt noted when increase in any pain pt became sick)     Hand Dominance     Extremity/Trunk Assessment Upper Extremity Assessment Upper Extremity Assessment: RUE deficits/detail RUE Deficits / Details: WFL for ADLS but had a fall in the past and due to this limited ROM RUE Sensation: WNL RUE Coordination: WNL   Lower Extremity Assessment Lower Extremity Assessment: Defer to PT evaluation   Cervical / Trunk Assessment Cervical / Trunk Assessment: Normal   Communication Communication Communication: No difficulties   Cognition Arousal/Alertness: Awake/alert Behavior During Therapy: WFL for tasks assessed/performed Overall Cognitive Status: Within Functional Limits for tasks assessed                                       General Comments       Exercises     Shoulder Instructions      Home Living Family/patient expects to be discharged to:: Private residence Living Arrangements: Alone Available Help at Discharge: Family;Friend(s);Neighbor Type of Home: House Home Access: Stairs to enter [employer] of Steps: 2 Entrance Stairs-Rails: None Home Layout: One level     Bathroom Shower/Tub: Tub/shower unit;Door (pt reports often just ses wipes to clean up)   Bathroom Toilet: Standard     Home Equipment: None (Pt reported they have access to a walker)          Prior Functioning/Environment Prior Level of Function : Working/employed (working part time at [employer])             Mobility Comments: Independent          OT  Problem List: Decreased strength;Decreased range of motion;Decreased activity tolerance;Impaired balance (sitting and/or standing);Decreased safety awareness;Decreased knowledge of use of DME or AE;Pain      OT Treatment/Interventions: Self-care/ADL training;DME and/or AE instruction;Therapeutic activities;Patient/family education;Balance training    OT Goals(Current goals can be found in the care plan section) Acute Rehab OT Goals Patient Stated Goal: to keep progressing to home OT Goal Formulation: With patient Time For Goal Achievement: 12/16/20 Potential to Achieve Goals: Good ADL Goals Pt Will Perform Upper Body Bathing: with modified independence;sitting Pt Will Perform Lower Body Bathing: with modified independence;sit to/from stand Pt Will Transfer to Toilet: with modified independence;ambulating;bedside commode Pt Will Perform Tub/Shower Transfer: Tub transfer;with set-up;shower seat;ambulating  OT Frequency: Min 2X/week   Barriers to D/C:            Co-evaluation              AM-PAC OT "6 Clicks" Daily Activity     Outcome Measure Help from another person eating meals?: None Help from another person taking care of personal grooming?: None Help from another person toileting, which includes using toliet, bedpan, or urinal?: A Little  Help from another person bathing (including washing, rinsing, drying)?: A Little Help from another person to put on and taking off regular upper body clothing?: A Little Help from another person to put on and taking off regular lower body clothing?: A Little 6 Click Score: 20   End of Session Equipment Utilized During Treatment: Gait belt;Rolling walker (2 wheels) Nurse Communication: Mobility status  Activity Tolerance: Other (comment) (Pt with increase/changes in pain will start to have vomit) Patient left: in chair;with call bell/phone within reach;with chair alarm set  OT Visit Diagnosis: Unsteadiness on feet (R26.81);Other  abnormalities of gait and mobility (R26.89);History of falling (Z91.81);Pain Pain - Right/Left: Right Pain - part of body: Hip                Time: 8871-9597 OT Time Calculation (min): 50 min Charges:  OT General Charges $OT Visit: 1 Visit OT Evaluation $OT Eval Low Complexity: 1 Low OT Treatments $Self Care/Home Management : 23-37 mins  Joeseph Amor OTR/L  Acute Rehab Services  [phone] office number [phone] pager number   Joeseph Amor 12/02/2020, 9:11 AM

## 2020-12-02 NOTE — Plan of Care (Signed)
Patient is OOB ambulating to chair. Nausea with increased pain while ambulating. Pain controlled. Plan for discharge tomorrow after therapy.   Problem: Clinical Measurements: Goal: Ability to maintain clinical measurements within normal limits will improve Outcome: Progressing Goal: Will remain free from infection Outcome: Progressing Goal: Diagnostic test results will improve Outcome: Progressing Goal: Respiratory complications will improve Outcome: Progressing Goal: Cardiovascular complication will be avoided Outcome: Progressing   Problem: Elimination: Goal: Will not experience complications related to bowel motility Outcome: Progressing Goal: Will not experience complications related to urinary retention Outcome: Progressing   Problem: Pain Managment: Goal: General experience of comfort will improve Outcome: Progressing   Problem: Safety: Goal: Ability to remain free from injury will improve Outcome: Progressing   Problem: Skin Integrity: Goal: Risk for impaired skin integrity will decrease Outcome: Progressing

## 2020-12-02 NOTE — Progress Notes (Signed)
Patient seen and examined, admitted earlier this morning by Dr. Posey Pronto -Briefly Ms. Santor is a 80/F with history of dyslipidemia and mitral valve prolapse was brought to the ED following a mechanical fall, followed by pain in her right buttock and inability to stand/bear weight, work-up in the ED noted nondisplaced right pubic ramus fracture  Pelvic fracture -Following mechanical fall -X-ray noted right pubic ramus fracture, orthopedics consulted, recommended conservative management and WBAT -Add naproxen -Lives alone, previously independent in ADLs -PT OT eval today  Dyslipidemia -Continue weekly statin  DVT prophylaxis: Lovenox Disposition: Likely home with home health services tomorrow  Domenic Polite, MD

## 2020-12-02 NOTE — H&P (Signed)
History and Physical    Carmen Rose HTD:[id number] DOB: Oct 04, 1940 DOA: 12/01/2020  PCP: Ginger Organ., MD  Patient coming from: Home  I have personally briefly reviewed patient's old medical records in [place]  Chief Complaint: Right hip pain after fall at home  HPI: Carmen Rose is a 80 y.o. female with medical history significant for hyperlipidemia and mitral valve prolapse who presented to the ED for evaluation of fall at home.  Patient lives alone independently.  She was walking her 2 small dogs in their leashes wrapped around her legs causing her to fall backwards landing on her buttocks.  She developed nonradiating pain to her right buttock.  She was unable to stand and bear weight or ambulate due to significant pain therefore came to the ED for further evaluation and management.  She did not hit her head or lose consciousness.  She denies any lightheadedness, dizziness, nausea, vomiting, chest pain, dyspnea, abdominal pain, dysuria, diarrhea.  ED Course:  Initial vitals showed BP 157/96, pulse 85, RR 16, temp 98.8 F, SPO2 93% on room air.  Labs show sodium 138, potassium 3.5, bicarb 23, BUN 11, creatinine 0.93, serum glucose 121, LFTs within normal limits, WBC 11.4, hemoglobin 15.7, platelets 249,000.  COVID and influenza PCR negative.  Right hip x-ray shows a nondisplaced fracture of the right superior pubic ramus.  Orthopedics were consulted.  Dr. Stann Mainland with [facility] evaluated the patient and recommended conservative management, weightbearing as tolerated, pain control, physical therapy, and follow-up in office 2 weeks postdischarge.  The hospitalist service was consulted to admit as patient was unable to ambulate even with two-person assist.  Review of Systems: All systems reviewed and are negative except as documented in history of present illness above.   Past Medical History:  Diagnosis Date   Anemia    50 yrs ago   Cancer ([place])     skin cancer right arm   Heart murmur    prolapsed mitral valve   Osteoporosis     Past Surgical History:  Procedure Laterality Date   ADENOIDECTOMY     COLONOSCOPY     POLYPECTOMY     right arm fracture     humerus   TONSILLECTOMY AND ADENOIDECTOMY      Social History:  reports that she has quit smoking. She has never used smokeless tobacco. She reports current alcohol use. She reports that she does not use drugs.  Allergies  Allergen Reactions   Alendronate     Flush face    Lipitor [Atorvastatin]     Extreme exhaustion   Milk-Related Compounds Diarrhea    Whole milk   Other Other (See Comments)    "pain killers" (constipation) Vitamin C(constipation) Anti inflammatory eye drops(swollen throat and vocal cords   Sudafed [Pseudoephedrine Hcl] Other (See Comments)    Vertigo and sleep problems   Adhesive [Tape] Rash   Caladryl [Pramoxine-Calamine] Rash    Contact dermatitis     Family History  Problem Relation Age of Onset   Cervical cancer Mother    Heart disease Mother    Lung cancer Father    Colon cancer Neg Hx    Rectal cancer Neg Hx    Stomach cancer Neg Hx      Prior to Admission medications   Medication Sig Start Date End Date Taking? Authorizing Provider  acetaminophen (TYLENOL) 500 MG tablet Take 250 mg by mouth every 6 (six) hours as needed for moderate pain or headache.  Yes [provider]  Biotin 1000 MCG tablet Take 1,000 mcg by mouth at bedtime.   Yes [provider]  Calcium Carb-Cholecalciferol 600-800 MG-UNIT TABS Take 1 capsule by mouth 2 (two) times daily before a meal.   Yes [provider]  Cholecalciferol (VITAMIN D-3 PO) Take 1,000 Units by mouth at bedtime.   Yes [provider]  Multiple Vitamins-Minerals (MULTIVITAMIN PO) Take 1 capsule by mouth daily.   Yes [provider]  rosuvastatin (CRESTOR) 10 MG tablet Take 10 mg by mouth every Sunday. 11/27/20  Yes [provider]     Physical Exam: Vitals:   12/01/20 1952 12/01/20 2100 12/01/20 2230 12/01/20 2330  BP: (!) 182/86 (!) 163/96 (!) 148/68 (!) 149/67  Pulse: 84 87 81 87  Resp: 18 16 18 17   Temp:      TempSrc:      SpO2: 100% 94% 91% 94%  Weight:      Height:       Constitutional: Resting in bed, NAD, calm, comfortable Eyes: PERRL, lids and conjunctivae normal ENMT: Mucous membranes are moist. Posterior pharynx clear of any exudate or lesions.Normal dentition.  Neck: normal, supple, no masses. Respiratory: clear to auscultation bilaterally, no wheezing, no crackles. Normal respiratory effort. No accessory muscle use.  Cardiovascular: Regular rate and rhythm, no murmurs / rubs / gallops. No extremity edema. 2+ pedal pulses. Abdomen: no tenderness, no masses palpated. No hepatosplenomegaly. Bowel sounds positive.  Musculoskeletal: no clubbing / cyanosis. No joint deformity upper and lower extremities. Good ROM while in bed, no contractures. Normal muscle tone.  Skin: no rashes, lesions, ulcers. No induration Neurologic: CN 2-12 grossly intact. Sensation intact. Strength 5/5 in all 4 while in bed.  Unable to stand or walk without maximal assistance Per EDP. Psychiatric: Normal judgment and insight. Alert and oriented x 3. Normal mood.   Labs on Admission: I have personally reviewed following labs and imaging studies  CBC: Recent Labs  Lab 12/01/20 2145  WBC 11.4*  NEUTROABS 8.8*  HGB 15.7*  HCT 45.9  MCV 92.9  PLT 419   Basic Metabolic Panel: Recent Labs  Lab 12/01/20 2145  NA 138  K 3.5  CL 105  CO2 23  GLUCOSE 121*  BUN 11  CREATININE 0.93  CALCIUM 9.2   GFR: Estimated Creatinine Clearance: 44.6 mL/min (by C-G formula based on SCr of 0.93 mg/dL). Liver Function Tests: Recent Labs  Lab 12/01/20 2145  AST 25  ALT 20  ALKPHOS 67  BILITOT 1.1  PROT 6.8  ALBUMIN 4.2   No results for input(s): LIPASE, AMYLASE in the last 168 hours. No results for input(s): AMMONIA in the  last 168 hours. Coagulation Profile: No results for input(s): INR, PROTIME in the last 168 hours. Cardiac Enzymes: No results for input(s): CKTOTAL, CKMB, CKMBINDEX, TROPONINI in the last 168 hours. BNP (last 3 results) No results for input(s): PROBNP in the last 8760 hours. HbA1C: No results for input(s): HGBA1C in the last 72 hours. CBG: No results for input(s): GLUCAP in the last 168 hours. Lipid Profile: No results for input(s): CHOL, HDL, LDLCALC, TRIG, CHOLHDL, LDLDIRECT in the last 72 hours. Thyroid Function Tests: No results for input(s): TSH, T4TOTAL, FREET4, T3FREE, THYROIDAB in the last 72 hours. Anemia Panel: No results for input(s): VITAMINB12, FOLATE, FERRITIN, TIBC, IRON, RETICCTPCT in the last 72 hours. Urine analysis:    Component Value Date/Time   COLORURINE YELLOW 10/13/2008 1011   APPEARANCEUR CLEAR 10/13/2008 1011   LABSPEC 1.027  10/13/2008 1011   PHURINE 6.0 10/13/2008 [place] 10/13/2008 [place] 10/13/2008 [place] 10/13/2008 1011   KETONESUR NEGATIVE 10/13/2008 1011   PROTEINUR NEGATIVE 10/13/2008 1011   UROBILINOGEN 0.2 10/13/2008 1011   NITRITE NEGATIVE 10/13/2008 1011   LEUKOCYTESUR SMALL (A) 10/13/2008 1011    Radiological Exams on Admission: DG Hip Unilat W or Wo Pelvis 2-3 Views Right  Result Date: 12/01/2020 CLINICAL DATA:  Fall and right hip pain. EXAM: DG HIP (WITH OR WITHOUT PELVIS) 2-3V RIGHT COMPARISON:  None FINDINGS: Nondisplaced fracture of the right superior pubic ramus. No other acute fracture identified. There is no dislocation. The bones are osteopenic. The soft tissues are unremarkable. Degenerative changes of the lower lumbar spine. IMPRESSION: Nondisplaced fracture of the right superior pubic ramus. Electronically Signed   By: Anner Crete M.D.   On: 12/01/2020 21:18    EKG: Not performed.  Assessment/Plan Principal Problem:   Closed fracture of superior pubic ramus, right,  initial encounter ([place])   Ron Junco is a 80 y.o. female with medical history significant for hyperlipidemia and mitral valve prolapse who is admitted with nondisplaced fracture of the right superior pubic ramus.  Nondisplaced fracture of right superior pubic ramus: Patient unable to stand or ambulate without maximum assistance.  Normally ambulates on her own and lives independently alone.  Currently unsafe to return home. -Conservative management per orthopedics, follow-up with Dr. Stann Mainland in office 2 weeks postdischarge -Weightbearing as tolerated -Continue analgesics as needed -PT/OT eval  Hyperlipidemia: Continue home rosuvastatin 10 mg every Sunday.  DVT prophylaxis: Lovenox Code Status: Full code, confirmed on admission Family Communication: Discussed with patient, she has discussed with family Disposition Plan: From home, dispo pending PT/OT eval Consults called: Orthopedics Level of care: Med-Surg Admission status:  Status is: Observation  The patient remains OBS appropriate and will d/c before 2 midnights.   Zada Finders MD [facility]  If 7PM-7AM, please contact night-coverage [email or web address]  12/02/2020, 12:38 AM

## 2020-12-02 NOTE — ED Notes (Signed)
Pt attempted to ambulate but was unable to stand or walk without maximum assistance.

## 2020-12-02 NOTE — Progress Notes (Signed)
Patient admited  from the ED and transferred from the stretcher to the bed. Pt is alert and oriented x 4. Pt stated that when she is at rest, her pain is at 2 on a 0 -10 scale. Pt oriented to the unit, call bell within reach.Will continue to monitor.

## 2020-12-03 DIAGNOSIS — S32511A Fracture of superior rim of right pubis, initial encounter for closed fracture: Secondary | ICD-10-CM | POA: Diagnosis not present

## 2020-12-03 LAB — CBC
HCT: 42.5 % (ref 36.0–46.0)
Hemoglobin: 14.4 g/dL (ref 12.0–15.0)
MCH: 31.9 pg (ref 26.0–34.0)
MCHC: 33.9 g/dL (ref 30.0–36.0)
MCV: 94.2 fL (ref 80.0–100.0)
Platelets: 195 10*3/uL (ref 150–400)
RBC: 4.51 MIL/uL (ref 3.87–5.11)
RDW: 13.2 % (ref 11.5–15.5)
WBC: 8.9 10*3/uL (ref 4.0–10.5)
nRBC: 0 % (ref 0.0–0.2)

## 2020-12-03 MED ORDER — NAPROXEN 375 MG PO TABS: 375.0000 mg | ORAL_TABLET | Freq: Two times a day (BID) | ORAL | 0 refills | Status: AC

## 2020-12-03 MED ORDER — ACETAMINOPHEN 500 MG PO TABS: 500.0000 mg | ORAL_TABLET | Freq: Four times a day (QID) | ORAL | 0 refills | Status: AC | PRN

## 2020-12-03 MED ORDER — SENNOSIDES-DOCUSATE SODIUM 8.6-50 MG PO TABS: 1.0000 | ORAL_TABLET | Freq: Every evening | ORAL | 0 refills | Status: AC | PRN

## 2020-12-03 NOTE — Plan of Care (Signed)
  Problem: Education: Goal: Knowledge of General Education information will improve Description: Including pain rating scale, medication(s)/side effects and non-pharmacologic comfort measures 12/03/2020 1028 by Shelton Silvas, RN Outcome: Adequate for Discharge 12/03/2020 1028 by Shelton Silvas, RN Outcome: Adequate for Discharge   Problem: Health Behavior/Discharge Planning: Goal: Ability to manage health-related needs will improve 12/03/2020 1028 by Shelton Silvas, RN Outcome: Adequate for Discharge 12/03/2020 1028 by Shelton Silvas, RN Outcome: Adequate for Discharge   Problem: Clinical Measurements: Goal: Ability to maintain clinical measurements within normal limits will improve 12/03/2020 1028 by Shelton Silvas, RN Outcome: Adequate for Discharge 12/03/2020 1028 by Shelton Silvas, RN Outcome: Adequate for Discharge Goal: Will remain free from infection 12/03/2020 1028 by Shelton Silvas, RN Outcome: Adequate for Discharge 12/03/2020 1028 by Shelton Silvas, RN Outcome: Adequate for Discharge Goal: Diagnostic test results will improve 12/03/2020 1028 by Shelton Silvas, RN Outcome: Adequate for Discharge 12/03/2020 1028 by Shelton Silvas, RN Outcome: Adequate for Discharge Goal: Respiratory complications will improve 12/03/2020 1028 by Shelton Silvas, RN Outcome: Adequate for Discharge 12/03/2020 1028 by Shelton Silvas, RN Outcome: Adequate for Discharge Goal: Cardiovascular complication will be avoided 12/03/2020 1028 by Shelton Silvas, RN Outcome: Adequate for Discharge 12/03/2020 1028 by Shelton Silvas, RN Outcome: Adequate for Discharge   Problem: Activity: Goal: Risk for activity intolerance will decrease 12/03/2020 1028 by Shelton Silvas, RN Outcome: Adequate for Discharge 12/03/2020 1028 by Shelton Silvas, RN Outcome: Adequate for Discharge   Problem: Nutrition: Goal: Adequate nutrition will be maintained 12/03/2020 1028 by Shelton Silvas,  RN Outcome: Adequate for Discharge 12/03/2020 1028 by Shelton Silvas, RN Outcome: Adequate for Discharge   Problem: Coping: Goal: Level of anxiety will decrease 12/03/2020 1028 by Shelton Silvas, RN Outcome: Adequate for Discharge 12/03/2020 1028 by Shelton Silvas, RN Outcome: Adequate for Discharge   Problem: Elimination: Goal: Will not experience complications related to bowel motility 12/03/2020 1028 by Shelton Silvas, RN Outcome: Adequate for Discharge 12/03/2020 1028 by Shelton Silvas, RN Outcome: Adequate for Discharge Goal: Will not experience complications related to urinary retention 12/03/2020 1028 by Shelton Silvas, RN Outcome: Adequate for Discharge 12/03/2020 1028 by Shelton Silvas, RN Outcome: Adequate for Discharge   Problem: Pain Managment: Goal: General experience of comfort will improve 12/03/2020 1028 by Shelton Silvas, RN Outcome: Adequate for Discharge 12/03/2020 1028 by Shelton Silvas, RN Outcome: Adequate for Discharge   Problem: Safety: Goal: Ability to remain free from injury will improve 12/03/2020 1028 by Shelton Silvas, RN Outcome: Adequate for Discharge 12/03/2020 1028 by Shelton Silvas, RN Outcome: Adequate for Discharge   Problem: Skin Integrity: Goal: Risk for impaired skin integrity will decrease 12/03/2020 1028 by Shelton Silvas, RN Outcome: Adequate for Discharge 12/03/2020 1028 by Shelton Silvas, RN Outcome: Adequate for Discharge

## 2020-12-03 NOTE — Progress Notes (Signed)
Physical Therapy Treatment Patient Details Name: Carmen Rose MRN: [id number] DOB: [birth year] Today's Date: 12/03/2020   History of Present Illness Pt. is 80 yr old F admitted on 12/01/20 with c/o being unable to walk on R LE after falling while walking dogs.  Imaging (+) for nondisplaced fx of R superior pubic ramus. PMH: anemia, skin CA, heart murmur, osteoporsis.    PT Comments    Continuing work on functional mobility and activity tolerance;  Session focused on stair training and gait training, and pt performed each well; Questions solicited and answered; OK for dc home from PT standpoint   Recommendations for follow up therapy are one component of a multi-disciplinary discharge planning process, led by the attending physician.  Recommendations may be updated based on patient status, additional functional criteria and insurance authorization.  Follow Up Recommendations  Home health PT     Assistance Recommended at Discharge PRN  Equipment Recommendations  None recommended by PT (borrowing equipment)    Recommendations for Other Services       Precautions / Restrictions Precautions Precautions: None Restrictions RLE Weight Bearing: Weight bearing as tolerated     Mobility  Bed Mobility                    Transfers Overall transfer level: Modified independent Equipment used: Rolling walker (2 wheels) Transfers: Sit to/from Stand Sit to Stand: Modified independent (Device/Increase time)           General transfer comment: No need for cueing today    Ambulation/Gait Ambulation/Gait assistance: Supervision Gait Distance (Feet): 50 Feet Assistive device: Rolling walker (2 wheels) Gait Pattern/deviations: Step-through pattern Gait velocity: slowed     General Gait Details: Good use of RW for support and good weight acceptance in R stance; adjusted height of RW for proper fit   Stairs Stairs: Yes Stairs assistance: Min assist Stair Management:  No rails;With walker;Backwards Number of Stairs: 2 General stair comments: Step-by-step cues for technique; performed stairs well, and tells me her brothers will be able to help her well; provided pt with handout for stair technique   Wheelchair Mobility    Modified Rankin (Stroke Patients Only)       Balance     Sitting balance-Leahy Scale: Good       Standing balance-Leahy Scale: Fair                              Cognition Arousal/Alertness: Awake/alert Behavior During Therapy: WFL for tasks assessed/performed Overall Cognitive Status: Within Functional Limits for tasks assessed                                          Exercises      General Comments General comments (skin integrity, edema, etc.): Discussed managing at home; assisted pt with hooking a try to her RW, and managing it with opening and closing her RW      Pertinent Vitals/Pain Pain Assessment: Faces Faces Pain Scale: Hurts a little bit Pain Location: R LE Pain Descriptors / Indicators: Aching;Throbbing;Discomfort Pain Intervention(s): Monitored during session    Home Living                          Prior Function            PT Goals (  current goals can now be found in the care plan section) Acute Rehab PT Goals Patient Stated Goal: Pt's goal is to return to independence/work PT Goal Formulation: With patient Time For Goal Achievement: 12/16/20 Potential to Achieve Goals: Good Progress towards PT goals: Progressing toward goals    Frequency    Min 5X/week      PT Plan Current plan remains appropriate    Co-evaluation              AM-PAC PT "6 Clicks" Mobility   Outcome Measure  Help needed turning from your back to your side while in a flat bed without using bedrails?: None Help needed moving from lying on your back to sitting on the side of a flat bed without using bedrails?: None Help needed moving to and from a bed to a chair  (including a wheelchair)?: None Help needed standing up from a chair using your arms (e.g., wheelchair or bedside chair)?: A Little Help needed to walk in hospital room?: A Little Help needed climbing 3-5 steps with a railing? : A Little 6 Click Score: 21    End of Session   Activity Tolerance: Patient tolerated treatment well Patient left: with call bell/phone within reach;Other (comment) (in wheelchair, ready to DC) Nurse Communication: Mobility status (and stair training done) PT Visit Diagnosis: Other abnormalities of gait and mobility (R26.89);History of falling (Z91.81);Pain Pain - Right/Left: Right Pain - part of body: Hip     Time: 7672-0947 PT Time Calculation (min) (ACUTE ONLY): 23 min  Charges:  $Gait Training: 8-22 mins $Therapeutic Activity: 8-22 mins                     Roney Marion, PT  Acute Rehabilitation Services Pager [phone] Office [place] 12/03/2020, 12:28 PM

## 2020-12-03 NOTE — Evaluation (Signed)
Occupational Therapy Evaluation Patient Details Name: Carmen Rose MRN: [id number] DOB: [birth year] Today's Date: 12/03/2020   History of Present Illness Pt. is 80 yr old F admitted on 12/01/20 with c/o being unable to walk on R LE after falling while walking dogs.  Imaging (+) for nondisplaced fx of R superior pubic ramus. PMH: anemia, skin CA, heart murmur, osteoporsis.   Clinical Impression   Pt making good progress towards OT goals. This session pt complete toileting, dressing, and progressing mobility, with good tolerance. Pt requiring supervision to min guard for functional mobility due to minimal balance concerns and pain. OT will continue to follow acutely.       Recommendations for follow up therapy are one component of a multi-disciplinary discharge planning process, led by the attending physician.  Recommendations may be updated based on patient status, additional functional criteria and insurance authorization.   Follow Up Recommendations  Home health OT    Assistance Recommended at Discharge Intermittent Supervision/Assistance  Functional Status Assessment     Equipment Recommendations  BSC/3in1    Recommendations for Other Services       Precautions / Restrictions Precautions Precautions: None Restrictions Weight Bearing Restrictions: Yes RLE Weight Bearing: Weight bearing as tolerated      Mobility Bed Mobility Overal bed mobility: Modified Independent                  Transfers Overall transfer level: Modified independent Equipment used: Rolling walker (2 wheels) Transfers: Sit to/from Stand Sit to Stand: Modified independent (Device/Increase time)           General transfer comment: No need for cueing today      Balance Overall balance assessment: Mild deficits observed, not formally tested   Sitting balance-Leahy Scale: Good       Standing balance-Leahy Scale: Fair                             ADL either  performed or assessed with clinical judgement   ADL Overall ADL's : Needs assistance/impaired     Grooming: Wash/dry hands;Wash/dry face;Oral care;Supervision/safety;Standing Grooming Details (indicate cue type and reason): completed at sink, no difficulties         Upper Body Dressing : Independent;Sitting Upper Body Dressing Details (indicate cue type and reason): Retrieved her bag of clothes from the closet and brought them to the bed. able to dress with no assist Lower Body Dressing: Supervision/safety;Sitting/lateral leans;Sit to/from stand Lower Body Dressing Details (indicate cue type and reason): No difficluties, supervision for safety Toilet Transfer: Min guard;Ambulation Toilet Transfer Details (indicate cue type and reason): A little wobbly with some pain/tightness in her groin are Toileting- Clothing Manipulation and Hygiene: Supervision/safety;Sitting/lateral lean;Sit to/from stand Toileting - Clothing Manipulation Details (indicate cue type and reason): No concerns with completeing from toilet     Functional mobility during ADLs: Min guard;Rolling walker (2 wheels) General ADL Comments: Pt very motivated and indepednent minded, able to find compensatory ways to safely complete ADL's/     Vision         Perception     Praxis      Pertinent Vitals/Pain Pain Assessment: Faces Faces Pain Scale: Hurts a little bit Pain Location: R LE Pain Descriptors / Indicators: Aching;Throbbing;Discomfort Pain Intervention(s): Monitored during session     Hand Dominance     Extremity/Trunk Assessment             Communication     Cognition  Arousal/Alertness: Awake/alert Behavior During Therapy: WFL for tasks assessed/performed Overall Cognitive Status: Within Functional Limits for tasks assessed                                       General Comments  Educated on fall risks and home management, overall doing well    Exercises     Shoulder  Instructions      Home Living                                          Prior Functioning/Environment                          OT Problem List:        OT Treatment/Interventions:      OT Goals(Current goals can be found in the care plan section) Acute Rehab OT Goals Patient Stated Goal: To keep working on her mobility OT Goal Formulation: With patient Time For Goal Achievement: 12/16/20 Potential to Achieve Goals: Good ADL Goals Pt Will Perform Upper Body Bathing: with modified independence;sitting Pt Will Perform Lower Body Bathing: with modified independence;sit to/from stand Pt Will Transfer to Toilet: with modified independence;ambulating;bedside commode Pt Will Perform Tub/Shower Transfer: Tub transfer;with set-up;shower seat;ambulating  OT Frequency: Min 2X/week   Barriers to D/C:            Co-evaluation              AM-PAC OT "6 Clicks" Daily Activity     Outcome Measure Help from another person eating meals?: None Help from another person taking care of personal grooming?: None Help from another person toileting, which includes using toliet, bedpan, or urinal?: A Little Help from another person bathing (including washing, rinsing, drying)?: A Little Help from another person to put on and taking off regular upper body clothing?: None Help from another person to put on and taking off regular lower body clothing?: A Little 6 Click Score: 21   End of Session Equipment Utilized During Treatment: Gait belt;Rolling walker (2 wheels) Nurse Communication: Mobility status  Activity Tolerance: Patient tolerated treatment well Patient left: in bed;with call bell/phone within reach  OT Visit Diagnosis: Unsteadiness on feet (R26.81);Other abnormalities of gait and mobility (R26.89);History of falling (Z91.81);Pain Pain - Right/Left: Right Pain - part of body: Hip                Time: 0900-0919 OT Time Calculation (min): 19 min Charges:   OT General Charges $OT Visit: 1 Visit OT Treatments $Self Care/Home Management : 8-22 mins  Carmen Ariel H., OTR/L Acute Rehabilitation  Carmen Rose Carmen Breezy Carmen Rose 12/03/2020, 3:54 PM

## 2020-12-03 NOTE — TOC Transition Note (Signed)
Transition of Care [facility]) - CM/SW Discharge Note   Patient Details  Name: Jaila Schellhorn MRN: [id number] Date of Birth: 04/24/1940  Transition of Care [facility]) CM/SW Contact:  Bartholomew Crews, RN Phone Number: [phone] 12/03/2020, 9:47 AM   Clinical Narrative:     Spoke with patient on hospital room phone. PTA home alone. Stated that she has family, friends, and neighbors who will check on her. Baseline is independents and works outside the home.   Discussed HH PT/OT recommendations. Patient agreeable. Offered choice of agency and reviewed ratings on [email or web address]. Referral accepted by [facility]. HH orders provided by MD.   Demographics and PCP verified.   No further TOC needs identified at this time.   Final next level of care: [place] Barriers to Discharge: No Barriers Identified   Patient Goals and CMS Choice Patient states their goals for this hospitalization and ongoing recovery are:: return home CMS Medicare.gov Compare Post Acute Care list provided to:: Patient Choice offered to / list presented to : Patient  Discharge Placement                       Discharge Plan and Services   Discharge Planning Services: CM Consult Post Acute Care Choice: Home Health          DME Arranged: N/A DME Agency: NA       HH Arranged: PT, OT [place] Agency: [place] Date [place]: 12/03/20 Time [place]: [phone] Representative spoke with at [place]: [place] ([place]) Interventions     Readmission Risk Interventions No flowsheet data found.

## 2020-12-10 ENCOUNTER — Encounter (HOSPITAL_BASED_OUTPATIENT_CLINIC_OR_DEPARTMENT_OTHER): Payer: Self-pay | Admitting: *Deleted

## 2020-12-10 ENCOUNTER — Other Ambulatory Visit: Payer: Self-pay

## 2020-12-10 ENCOUNTER — Emergency Department (HOSPITAL_BASED_OUTPATIENT_CLINIC_OR_DEPARTMENT_OTHER): Payer: Medicare Other

## 2020-12-10 ENCOUNTER — Emergency Department (HOSPITAL_BASED_OUTPATIENT_CLINIC_OR_DEPARTMENT_OTHER)
Admission: EM | Admit: 2020-12-10 | Discharge: 2020-12-10 | Disposition: A | Discharge status: Home / Self Care | Payer: Medicare Other | Source: Home / Self Care | Attending: Emergency Medicine | Admitting: Emergency Medicine

## 2020-12-10 DIAGNOSIS — S32512A Fracture of superior rim of left pubis, initial encounter for closed fracture: Secondary | ICD-10-CM | POA: Insufficient documentation

## 2020-12-10 DIAGNOSIS — Z87891 Personal history of nicotine dependence: Secondary | ICD-10-CM | POA: Insufficient documentation

## 2020-12-10 DIAGNOSIS — W1839XA Other fall on same level, initial encounter: Secondary | ICD-10-CM | POA: Insufficient documentation

## 2020-12-10 DIAGNOSIS — W19XXXD Unspecified fall, subsequent encounter: Secondary | ICD-10-CM

## 2020-12-10 DIAGNOSIS — M5416 Radiculopathy, lumbar region: Secondary | ICD-10-CM | POA: Diagnosis not present

## 2020-12-10 DIAGNOSIS — Z85820 Personal history of malignant melanoma of skin: Secondary | ICD-10-CM | POA: Insufficient documentation

## 2020-12-10 DIAGNOSIS — S32431A Displaced fracture of anterior column [iliopubic] of right acetabulum, initial encounter for closed fracture: Secondary | ICD-10-CM | POA: Insufficient documentation

## 2020-12-10 DIAGNOSIS — S32591A Other specified fracture of right pubis, initial encounter for closed fracture: Secondary | ICD-10-CM | POA: Diagnosis not present

## 2020-12-10 DIAGNOSIS — S3210XA Unspecified fracture of sacrum, initial encounter for closed fracture: Secondary | ICD-10-CM

## 2020-12-10 DIAGNOSIS — S32512D Fracture of superior rim of left pubis, subsequent encounter for fracture with routine healing: Secondary | ICD-10-CM

## 2020-12-10 DIAGNOSIS — S32301A Unspecified fracture of right ilium, initial encounter for closed fracture: Secondary | ICD-10-CM | POA: Insufficient documentation

## 2020-12-10 LAB — URINALYSIS, ROUTINE W REFLEX MICROSCOPIC
Bilirubin Urine: NEGATIVE
Glucose, UA: NEGATIVE mg/dL
Hgb urine dipstick: NEGATIVE
Leukocytes,Ua: NEGATIVE
Nitrite: NEGATIVE
Protein, ur: NEGATIVE mg/dL
Specific Gravity, Urine: 1.009 (ref 1.005–1.030)
pH: 6.5 (ref 5.0–8.0)

## 2020-12-10 NOTE — ED Provider Notes (Addendum)
[facility] EMERGENCY Provider Note: Carmen Spurling, MD, FACEP  CSN: [id number] MRN: [id number] ARRIVAL: 12/10/20 at [place]: [place]  Leg Pain   HISTORY OF PRESENT ILLNESS  12/10/20 4:21 AM Carmen Rose is a 80 y.o. female who stumbled over her dog's and fell backwards on 12/01/2020.  She was diagnosed with a nondisplaced right superior pelvic ramus fracture.  She was evaluated by Dr. Victorino December of [facility] who deemed her fracture to be nonoperative.  She was discharged home 1 week ago and has been weightbearing as tolerated using a walker.  She had been taking naproxen for pain but has not had any for 3 days.  She has been incontinent of urine since the accident and has been wearing Depends.  This morning, just before arrival, she was in bed and suddenly had a severe, sharp pain in her mid lower back radiating to both buttocks and down the back of the left thigh.  The pain in the back and buttocks has subsequently resolved and she is now having an aching pain in the back of her left thigh which she rates as an 5 out of 10.  It is not worse with movement of her left leg.  She does still have some pain in her right groin fold anteriorly with weightbearing.   Past Medical History:  Diagnosis Date   Anemia    50 yrs ago   Cancer ([place])    skin cancer right arm   Heart murmur    prolapsed mitral valve   Osteoporosis     Past Surgical History:  Procedure Laterality Date   ADENOIDECTOMY     COLONOSCOPY     POLYPECTOMY     right arm fracture     humerus   TONSILLECTOMY AND ADENOIDECTOMY      Family History  Problem Relation Age of Onset   Cervical cancer Mother    Heart disease Mother    Lung cancer Father    Colon cancer Neg Hx    Rectal cancer Neg Hx    Stomach cancer Neg Hx     Social History   Tobacco Use   Smoking status: Former   Smokeless tobacco: Never  Substance Use Topics   Alcohol use: Yes    Comment: occasional   Drug  use: No    Prior to Admission medications   Medication Sig Start Date End Date Taking? Authorizing Provider  acetaminophen (TYLENOL) 500 MG tablet Take 1 tablet (500 mg total) by mouth every 6 (six) hours as needed for moderate pain or headache. 12/03/20   Domenic Polite, MD  Biotin 1000 MCG tablet Take 1,000 mcg by mouth at bedtime.    [provider]  Calcium Carb-Cholecalciferol 600-800 MG-UNIT TABS Take 1 capsule by mouth 2 (two) times daily before a meal.    [provider]  Cholecalciferol (VITAMIN D-3 PO) Take 1,000 Units by mouth at bedtime.    [provider]  Multiple Vitamins-Minerals (MULTIVITAMIN PO) Take 1 capsule by mouth daily.    [provider]  rosuvastatin (CRESTOR) 10 MG tablet Take 10 mg by mouth every Sunday. 11/27/20   [provider]  senna-docusate (SENOKOT-S) 8.6-50 MG tablet Take 1 tablet by mouth at bedtime as needed for mild constipation. 12/03/20   Domenic Polite, MD    Allergies Alendronate, Lipitor [atorvastatin], Milk-related compounds, Other, Sudafed [pseudoephedrine hcl], Adhesive [tape], and Caladryl [pramoxine-calamine]   REVIEW OF SYSTEMS  Negative except as noted here or  in the History of Present Illness.   PHYSICAL EXAMINATION  Initial Vital Signs Blood pressure (!) 182/96, pulse 97, temperature 98.1 F (36.7 C), temperature source Oral, resp. rate 16, height 5\' 7"  (1.702 m), weight 53 kg, SpO2 99 %.  Examination General: Well-developed, well-nourished female in no acute distress; appearance consistent with age of record HENT: normocephalic; atraumatic Eyes: Normal appearance Neck: supple Heart: regular rate and rhythm Lungs: clear to auscultation bilaterally Abdomen: soft; nondistended; nontender; bowel sounds present Back: Nontender; negative straight leg raise bilaterally (straight leg raise of right leg does cause pain in right groin) Extremities: No deformity; full range of motion; pulses  normal; no edema Neurologic: Awake, alert and oriented; motor function intact in all extremities and symmetric; no facial droop Skin: Warm and dry; healing ecchymosis anterior left thigh proximal to the knee Psychiatric: Normal mood and affect   RESULTS  Summary of this visit's results, reviewed and interpreted by myself:   EKG Interpretation  Date/Time:    Ventricular Rate:    PR Interval:    QRS Duration:   QT Interval:    QTC Calculation:   R Axis:     Text Interpretation:         Laboratory Studies: Results for orders placed or performed during the hospital encounter of 12/10/20 (from the past 24 hour(s))  Urinalysis, Routine w reflex microscopic Urine, Clean Catch     Status: Abnormal   Collection Time: 12/10/20  4:28 AM  Result Value Ref Range   Color, Urine YELLOW YELLOW   APPearance CLEAR CLEAR   Specific Gravity, Urine 1.009 1.005 - 1.030   pH 6.5 5.0 - 8.0   Glucose, UA NEGATIVE NEGATIVE mg/dL   Hgb urine dipstick NEGATIVE NEGATIVE   Bilirubin Urine NEGATIVE NEGATIVE   Ketones, ur TRACE (A) NEGATIVE mg/dL   Protein, ur NEGATIVE NEGATIVE mg/dL   Nitrite NEGATIVE NEGATIVE   Leukocytes,Ua NEGATIVE NEGATIVE   Imaging Studies: CT PELVIS WO CONTRAST  Result Date: 12/10/2020 CLINICAL DATA:  Pelvic trauma. Recent diagnosis of right pelvic fracture. Complains of increased incontinence. EXAM: CT PELVIS WITHOUT CONTRAST TECHNIQUE: Multidetector CT imaging of the pelvis was performed following the standard protocol without intravenous contrast. COMPARISON:  No prior cross-sectional imaging. AP pelvis and right hip series are available from 12/01/2020. FINDINGS: Urinary Tract: There is slight thickening of the bladder wall versus underdistention, mild perivesical stranding is also seen and may suggest cystitis. The distal ureters are decompressed without stones. Bowel:  No wall thickening or dilatation including the appendix. Vascular/Lymphatic: No enlarged lymph nodes.  Moderate aortoiliac calcific plaques without distal AAA. Reproductive: The uterus is intact. There are calcifications in the broad ligaments. The ovaries are not enlarged. Other:  There is no free air, hemorrhage or fluid Musculoskeletal: Generalized osteopenia. There are longitudinal intra-articular fractures of the anterior aspect of both sides of the sacrum extending over several slices, both with slight cortical offset and may have been present on 12/01/2020 but would have been difficult to see. Also noted is a slight cortical fracture buckling in the central right iliac wing, a minimally displaced intra-articular fracture through the anterior column of the right acetabulum, and an oblique fracture through the mid right inferior pubic ramus with the anterior fragment offset by 1/2 of the bone width. The remainder of the sacrum, remainder of the bony pelvis, and visualized proximal femurs bilaterally intact. There is mild narrowing and spurring of the SI joints, pubic symphysis and hip joints, mild enthesopathic change of the pelvis  and femoral trochanters. There are stranding changes in the right pelvic side wall anteriorly and inferiorly along side the fractures but no space-occupying pelvic hematoma. IMPRESSION: 1. Again noted recent fractures of the anterior column right acetabulum with slight displacement, and of the inferior right pubic ramus. There is only a small right hip joint effusion. 2. Periarticular longitudinal fractures of the anterior aspects of the sacrum bilaterally along the SI joints, only slightly displaced and could have been present on the recent plain films and not able to be seen. 3. Cortical buckling fracture of the central right iliac wing. 4. Osteopenia and degenerative change without further visible fracture. 5. Aortoiliac atherosclerosis. 6. Possible cystitis. 7. Small amount of stranding along side the right-sided fractures in the pelvic wall but no space-occupying pelvic hematoma.  Electronically Signed   By: Telford Nab M.D.   On: 12/10/2020 05:10    ED COURSE and MDM  Nursing notes, initial and subsequent vitals signs, including pulse oximetry, reviewed and interpreted by myself.  Vitals:   12/10/20 0418 12/10/20 0420  BP: (!) 182/96   Pulse: 97   Resp: 16   Temp: 98.1 F (36.7 C)   TempSrc: Oral   SpO2: 99%   Weight:  53 kg  Height:  5\' 7"  (1.702 m)   Medications - No data to display  5:22 AM The patient's pelvic fracture is obviously more complex than what was seen on plain film and diagnosed on previous visit.  The sacral fractures are likely the cause of her acute pain which fortunately is improving.  Her pelvic ring it remains stable and she has an appointment with Dr. Stann Mainland the day after tomorrow.  I sent him a message advising him of the additional findings on CT scan.  The patient was advised to minimize weightbearing pending follow-up and to use a walker when she must ambulate.   PROCEDURES  Procedures   ED DIAGNOSES     ICD-10-CM   1. Fall, subsequent encounter  W19.XXXD     2. Closed fracture of superior ramus of left pubis with routine healing, subsequent encounter  S32.512D     3. Closed displaced fracture of anterior column of right acetabulum, initial encounter ([place])  S32.431A     4. Closed fracture of right iliac wing, initial encounter ([place])  S32.301A     5. Closed fracture of sacrum, unspecified portion of sacrum, initial encounter ([place])  S32.10XA          Yeng Perz, MD 12/10/20 0530    Shanon Rosser, MD 12/10/20 [id number]

## 2020-12-10 NOTE — ED Notes (Signed)
Patient transported to CT 

## 2020-12-10 NOTE — ED Triage Notes (Signed)
Pt arrives via EMS with c/o left leg pain. Pt also says she has increased incontinence recently. Pt reports recent admission for pelvic fracture.

## 2020-12-12 ENCOUNTER — Inpatient Hospital Stay (HOSPITAL_COMMUNITY)
Admission: EM | Admit: 2020-12-12 | Discharge: 2020-12-25 | DRG: 536 | Disposition: A | Discharge status: Skilled Nursing Facility | Payer: Medicare Other | Attending: Internal Medicine | Admitting: Internal Medicine

## 2020-12-12 ENCOUNTER — Observation Stay (HOSPITAL_COMMUNITY): Payer: Medicare Other

## 2020-12-12 ENCOUNTER — Emergency Department (HOSPITAL_COMMUNITY): Payer: Medicare Other

## 2020-12-12 ENCOUNTER — Encounter (HOSPITAL_COMMUNITY): Payer: Self-pay

## 2020-12-12 ENCOUNTER — Other Ambulatory Visit: Payer: Self-pay

## 2020-12-12 DIAGNOSIS — R636 Underweight: Secondary | ICD-10-CM | POA: Diagnosis present

## 2020-12-12 DIAGNOSIS — M7918 Myalgia, other site: Secondary | ICD-10-CM | POA: Diagnosis present

## 2020-12-12 DIAGNOSIS — S32119A Unspecified Zone I fracture of sacrum, initial encounter for closed fracture: Secondary | ICD-10-CM | POA: Diagnosis present

## 2020-12-12 DIAGNOSIS — Z20822 Contact with and (suspected) exposure to covid-19: Secondary | ICD-10-CM | POA: Diagnosis present

## 2020-12-12 DIAGNOSIS — E782 Mixed hyperlipidemia: Secondary | ICD-10-CM | POA: Diagnosis present

## 2020-12-12 DIAGNOSIS — Z681 Body mass index (BMI) 19 or less, adult: Secondary | ICD-10-CM

## 2020-12-12 DIAGNOSIS — S3210XA Unspecified fracture of sacrum, initial encounter for closed fracture: Secondary | ICD-10-CM

## 2020-12-12 DIAGNOSIS — Z79899 Other long term (current) drug therapy: Secondary | ICD-10-CM

## 2020-12-12 DIAGNOSIS — Z91048 Other nonmedicinal substance allergy status: Secondary | ICD-10-CM

## 2020-12-12 DIAGNOSIS — S32139A Unspecified Zone III fracture of sacrum, initial encounter for closed fracture: Secondary | ICD-10-CM | POA: Diagnosis present

## 2020-12-12 DIAGNOSIS — M810 Age-related osteoporosis without current pathological fracture: Secondary | ICD-10-CM | POA: Diagnosis present

## 2020-12-12 DIAGNOSIS — S32511A Fracture of superior rim of right pubis, initial encounter for closed fracture: Secondary | ICD-10-CM | POA: Diagnosis not present

## 2020-12-12 DIAGNOSIS — R32 Unspecified urinary incontinence: Secondary | ICD-10-CM | POA: Diagnosis present

## 2020-12-12 DIAGNOSIS — S32591A Other specified fracture of right pubis, initial encounter for closed fracture: Principal | ICD-10-CM | POA: Diagnosis present

## 2020-12-12 DIAGNOSIS — R102 Pelvic and perineal pain: Secondary | ICD-10-CM

## 2020-12-12 DIAGNOSIS — M4316 Spondylolisthesis, lumbar region: Secondary | ICD-10-CM | POA: Diagnosis not present

## 2020-12-12 DIAGNOSIS — M5416 Radiculopathy, lumbar region: Secondary | ICD-10-CM

## 2020-12-12 DIAGNOSIS — B961 Klebsiella pneumoniae [K. pneumoniae] as the cause of diseases classified elsewhere: Secondary | ICD-10-CM | POA: Diagnosis present

## 2020-12-12 DIAGNOSIS — Z87891 Personal history of nicotine dependence: Secondary | ICD-10-CM

## 2020-12-12 DIAGNOSIS — I341 Nonrheumatic mitral (valve) prolapse: Secondary | ICD-10-CM | POA: Diagnosis present

## 2020-12-12 DIAGNOSIS — M48061 Spinal stenosis, lumbar region without neurogenic claudication: Secondary | ICD-10-CM | POA: Diagnosis present

## 2020-12-12 DIAGNOSIS — K5900 Constipation, unspecified: Secondary | ICD-10-CM | POA: Diagnosis present

## 2020-12-12 DIAGNOSIS — W1830XA Fall on same level, unspecified, initial encounter: Secondary | ICD-10-CM | POA: Diagnosis present

## 2020-12-12 DIAGNOSIS — Z888 Allergy status to other drugs, medicaments and biological substances status: Secondary | ICD-10-CM

## 2020-12-12 DIAGNOSIS — Z91011 Allergy to milk products: Secondary | ICD-10-CM

## 2020-12-12 DIAGNOSIS — S32131D Minimally displaced Zone III fracture of sacrum, subsequent encounter for fracture with routine healing: Secondary | ICD-10-CM

## 2020-12-12 DIAGNOSIS — I10 Essential (primary) hypertension: Secondary | ICD-10-CM | POA: Diagnosis present

## 2020-12-12 DIAGNOSIS — N390 Urinary tract infection, site not specified: Secondary | ICD-10-CM | POA: Diagnosis present

## 2020-12-12 DIAGNOSIS — Z85828 Personal history of other malignant neoplasm of skin: Secondary | ICD-10-CM

## 2020-12-12 LAB — CBC WITH DIFFERENTIAL/PLATELET
Abs Immature Granulocytes: 0.04 10*3/uL (ref 0.00–0.07)
Basophils Absolute: 0.1 10*3/uL (ref 0.0–0.1)
Basophils Relative: 1 %
Eosinophils Absolute: 0.1 10*3/uL (ref 0.0–0.5)
Eosinophils Relative: 1 %
HCT: 45.5 % (ref 36.0–46.0)
Hemoglobin: 15.5 g/dL — ABNORMAL HIGH (ref 12.0–15.0)
Immature Granulocytes: 0 %
Lymphocytes Relative: 15 %
Lymphs Abs: 1.4 10*3/uL (ref 0.7–4.0)
MCH: 31.6 pg (ref 26.0–34.0)
MCHC: 34.1 g/dL (ref 30.0–36.0)
MCV: 92.7 fL (ref 80.0–100.0)
Monocytes Absolute: 1.1 10*3/uL — ABNORMAL HIGH (ref 0.1–1.0)
Monocytes Relative: 11 %
Neutro Abs: 6.8 10*3/uL (ref 1.7–7.7)
Neutrophils Relative %: 72 %
Platelets: 474 10*3/uL — ABNORMAL HIGH (ref 150–400)
RBC: 4.91 MIL/uL (ref 3.87–5.11)
RDW: 13.2 % (ref 11.5–15.5)
WBC: 9.5 10*3/uL (ref 4.0–10.5)
nRBC: 0 % (ref 0.0–0.2)

## 2020-12-12 LAB — COMPREHENSIVE METABOLIC PANEL
ALT: 23 U/L (ref 0–44)
AST: 29 U/L (ref 15–41)
Albumin: 3.7 g/dL (ref 3.5–5.0)
Alkaline Phosphatase: 86 U/L (ref 38–126)
Anion gap: 13 (ref 5–15)
BUN: 8 mg/dL (ref 8–23)
CO2: 22 mmol/L (ref 22–32)
Calcium: 9.3 mg/dL (ref 8.9–10.3)
Chloride: 104 mmol/L (ref 98–111)
Creatinine, Ser: 0.84 mg/dL (ref 0.44–1.00)
GFR, Estimated: >60 mL/min (ref >60)
Glucose, Bld: 104 mg/dL — ABNORMAL HIGH (ref 70–99)
Potassium: 3.3 mmol/L — ABNORMAL LOW (ref 3.5–5.1)
Sodium: 139 mmol/L (ref 135–145)
Total Bilirubin: 0.9 mg/dL (ref 0.3–1.2)
Total Protein: 6.7 g/dL (ref 6.5–8.1)

## 2020-12-12 MED ORDER — KETOROLAC TROMETHAMINE 30 MG/ML IJ SOLN
15.0000 mg | Freq: Once | INTRAMUSCULAR | Status: AC
  Administered 2020-12-12: 15 mg via INTRAVENOUS
  Filled 2020-12-12: qty 1

## 2020-12-12 MED ORDER — DIAZEPAM 5 MG/ML IJ SOLN
2.5000 mg | Freq: Once | INTRAMUSCULAR | Status: AC
  Administered 2020-12-12: 2.5 mg via INTRAVENOUS
  Filled 2020-12-12: qty 2

## 2020-12-12 MED ORDER — ONDANSETRON HCL 4 MG PO TABS
4.0000 mg | ORAL_TABLET | Freq: Four times a day (QID) | ORAL | Status: DC | PRN
  Administered 2020-12-25: 16:00:00 4 mg via ORAL
  Filled 2020-12-12: qty 1

## 2020-12-12 MED ORDER — MORPHINE SULFATE (PF) 4 MG/ML IV SOLN
4.0000 mg | Freq: Once | INTRAVENOUS | Status: AC
  Administered 2020-12-12: 4 mg via INTRAVENOUS
  Filled 2020-12-12: qty 1

## 2020-12-12 MED ORDER — POLYETHYLENE GLYCOL 3350 17 G PO PACK
17.0000 g | PACK | Freq: Every day | ORAL | Status: DC | PRN
  Administered 2020-12-18: 17 g via ORAL
  Filled 2020-12-12: qty 1

## 2020-12-12 MED ORDER — ENOXAPARIN SODIUM 40 MG/0.4ML IJ SOSY
40.0000 mg | PREFILLED_SYRINGE | Freq: Every day | INTRAMUSCULAR | Status: DC
  Administered 2020-12-15 – 2020-12-24 (×10): 40 mg via SUBCUTANEOUS
  Filled 2020-12-12 (×10): qty 0.4

## 2020-12-12 MED ORDER — ONDANSETRON HCL 4 MG/2ML IJ SOLN
4.0000 mg | Freq: Once | INTRAMUSCULAR | Status: AC
  Administered 2020-12-12: 4 mg via INTRAVENOUS
  Filled 2020-12-12: qty 2

## 2020-12-12 MED ORDER — ACETAMINOPHEN 325 MG PO TABS
650.0000 mg | ORAL_TABLET | Freq: Four times a day (QID) | ORAL | Status: DC | PRN
  Administered 2020-12-13 – 2020-12-18 (×8): 650 mg via ORAL
  Administered 2020-12-22 (×2): 325 mg via ORAL
  Filled 2020-12-12 (×12): qty 2

## 2020-12-12 MED ORDER — TIZANIDINE HCL 2 MG PO TABS
2.0000 mg | ORAL_TABLET | Freq: Three times a day (TID) | ORAL | Status: DC | PRN
  Administered 2020-12-14 – 2020-12-24 (×12): 2 mg via ORAL
  Filled 2020-12-12 (×13): qty 1

## 2020-12-12 MED ORDER — ROSUVASTATIN CALCIUM 5 MG PO TABS
10.0000 mg | ORAL_TABLET | ORAL | Status: DC
  Administered 2020-12-17 – 2020-12-24 (×2): 10 mg via ORAL
  Filled 2020-12-12 (×2): qty 2

## 2020-12-12 MED ORDER — ACETAMINOPHEN 650 MG RE SUPP: 650.0000 mg | Freq: Four times a day (QID) | RECTAL | Status: DC | PRN

## 2020-12-12 MED ORDER — ONDANSETRON HCL 4 MG/2ML IJ SOLN: 4.0000 mg | Freq: Four times a day (QID) | INTRAMUSCULAR | Status: DC | PRN

## 2020-12-12 NOTE — Social Work (Signed)
CSW met with Pt at bedside. TOC initial assessment complete. FL2 completed and sent to area SNFs with initial referral.  Still waiting on PT/OT eval. Auth NOT started due to no PT eval yet.

## 2020-12-12 NOTE — NC FL2 (Signed)
[place] LEVEL OF CARE SCREENING TOOL     IDENTIFICATION  Patient Name: Carmen Rose Birthdate: 1940-03-06 Sex: female Admission Date (Current Location): 12/12/2020  [facility] and [place] Number:  Herbalist and Address:  [facility] [place] [address] [place], [place] [address]      Provider Number: [id number]  Attending Physician Name and Address:  Drenda Freeze, MD  Relative Name and Phone Number:  Carmen Rose, Carmen Rose   [phone]    Current Level of Care: Hospital Recommended Level of Care: [place] Prior Approval Number:    Date Approved/Denied:   PASRR Number: [id number]  Discharge Plan: SNF    Current Diagnoses: Patient Active Problem List   Diagnosis Date Noted   Closed fracture of superior pubic ramus, right, initial encounter ([place]) 12/02/2020    Orientation RESPIRATION BLADDER Height & Weight     Self, Time, Situation, Place  Normal Incontinent Weight:   Height:     BEHAVIORAL SYMPTOMS/MOOD NEUROLOGICAL BOWEL NUTRITION STATUS      Continent Diet (regular)  AMBULATORY STATUS COMMUNICATION OF NEEDS Skin   Limited Assist Verbally Normal                       Personal Care Assistance Level of Assistance  Bathing, Feeding, Dressing Bathing Assistance: Limited assistance Feeding assistance: Independent Dressing Assistance: Limited assistance     Functional Limitations Info  Sight, Hearing, Speech Sight Info: Adequate Hearing Info: Adequate Speech Info: Adequate    SPECIAL CARE FACTORS FREQUENCY  PT (By licensed PT), OT (By licensed OT)     PT Frequency: 5x weekly OT Frequency: 5x weekly            Contractures Contractures Info: Not present    Additional Factors Info  Code Status, Allergies Code Status Info: Full Allergies Info: (7)Alendronate,Atorvastatin, Caladryl, Milk-Related compounds,Sudafed,Anti inflammatory eye drops, vitamin C            Current Medications (12/12/2020):  This is the current hospital active medication list Current Facility-Administered Medications  Medication Dose Route Frequency Provider Last Rate Last Admin   diazepam (VALIUM) injection 2.5 mg  2.5 mg Intravenous Once Drenda Freeze, MD       ketorolac (TORADOL) 30 MG/ML injection 15 mg  15 mg Intravenous Once Drenda Freeze, MD       Current Outpatient Medications  Medication Sig Dispense Refill   acetaminophen (TYLENOL) 500 MG tablet Take 1 tablet (500 mg total) by mouth every 6 (six) hours as needed for moderate pain or headache.  0   Biotin 1000 MCG tablet Take 1,000 mcg by mouth at bedtime.     Calcium Carb-Cholecalciferol 600-800 MG-UNIT TABS Take 1 capsule by mouth 2 (two) times daily before a meal.     Cholecalciferol (VITAMIN D-3 PO) Take 1,000 Units by mouth at bedtime.     Multiple Vitamins-Minerals (MULTIVITAMIN PO) Take 1 capsule by mouth daily.     oxyCODONE (OXY IR/ROXICODONE) 5 MG immediate release tablet Take 2.5-5 mg by mouth every 4 (four) hours as needed for moderate pain.     rosuvastatin (CRESTOR) 10 MG tablet Take 10 mg by mouth every Sunday.     senna-docusate (SENOKOT-S) 8.6-50 MG tablet Take 1 tablet by mouth at bedtime as needed for mild constipation. 10 tablet 0   tizanidine (ZANAFLEX) 2 MG capsule Take 2 mg by mouth 3 (three) times daily as needed for muscle spasms.  Discharge Medications: Please see discharge summary for a list of discharge medications.  Relevant Imaging Results:  Relevant Lab Results:   Additional Information SSN# [id number]/ Pt has had 2 Covid vaccines + 3 boosters  Carmen Locurto, LCSW

## 2020-12-12 NOTE — ED Provider Notes (Signed)
Emergency Medicine Provider Triage Evaluation Note  Carmen Rose , a 80 y.o. female  was evaluated in triage.  Pt complains of persistent pelvic pain. Patient had a mechanical fall on 11/25 where she was evaluated in the ED and found to have a nondisplaced fracture of the right superior pubic rami. Patient was admitted to the hospital at that time for orthopedics consultation and pain management. Patient evaluated by Dr. Stann Mainland with orthopedics who did not recommend surgery. Patient discharged from hospital on 11/27. She was seen again in the ED on 12/4 due to continued pain and urinary incontinence where at CT was obtained which demonstrated: IMPRESSION: 1. Again noted recent fractures of the anterior column right acetabulum with slight displacement, and of the inferior right pubic ramus. There is only a small right hip joint effusion. 2. Periarticular longitudinal fractures of the anterior aspects of the sacrum bilaterally along the SI joints, only slightly displaced and could have been present on the recent plain films and not able to be seen. 3. Cortical buckling fracture of the central right iliac wing. 4. Osteopenia and degenerative change without further visible fracture. 5. Aortoiliac atherosclerosis. 6. Possible cystitis. 7. Small amount of stranding along side the right-sided fractures in the pelvic wall but no space-occupying pelvic hematoma.  Patient states pain began to get better 1 week ago; however 6 days ago she "plopped" on the cough causing severe pain in her pelvic region. No fever or chills. Patient states she has been unable to ambulate with walker due to pain. She admits to urinary incontinence due to inability to make it to the bathroom quick enough.  Review of Systems  Positive: arthralgia Negative: fever  Physical Exam  BP (!) 160/77 (BP Location: Right Arm)   Pulse 90   Temp 98.5 F (36.9 C) (Oral)   Resp 14   SpO2 96%  Gen:   Awake, no distress    Resp:  Normal effort  MSK:   Moves extremities without difficulty  Other:  Sensation intake. Patient able to move both lower extremities without difficulty  Medical Decision Making  Medically screening exam initiated at 1:10 PM.  Appropriate orders placed.  Iven Finn was informed that the remainder of the evaluation will be completed by another provider, this initial triage assessment does not replace that evaluation, and the importance of remaining in the ED until their evaluation is complete.  Recent pelvic fracture, new possible injury 6 days ago. Repeat CT scan.    Suzy Bouchard, PA-C 12/12/20 1324    Wyvonnia Dusky, MD 12/12/20 1623

## 2020-12-12 NOTE — ED Triage Notes (Signed)
Patient here following fall a week ago Friday following fall with non-separated pelvic fx. Patient discharged first of week. Patient complains of ongoing pain and has been experiencing some incontinence. Reports ongoing pain to left buttocks and left leg

## 2020-12-12 NOTE — ED Notes (Signed)
Patient transported to CT 

## 2020-12-12 NOTE — ED Provider Notes (Signed)
[facility] EMERGENCY DEPARTMENT Provider Note   CSN: [id number] Arrival date & time: 12/12/20  1227     History No chief complaint on file.   Carmen Rose is a 80 y.o. female history of osteoporosis, right pelvic fracture here presenting with increasing weakness and possible incontinence.  Patient had a fall on 11/25.  She states that she had a mechanical fall and ended up having a pelvic fracture.  Patient was admitted to the hospital for pain control at that time.  Physical therapy saw patient and recommended home health.  Patient subsequently went home.  Patient states that since she has been at home, she has been having worsening pain in the right pelvic area.  Patient walks with a walker.  She ended up having more pain radiating down the leg so went back to the ED on 12/4 and had a CT that showed stable pelvic fracture.  Patient states that since the last 2 days, she has progressively feeling more weakness.  She also states that she noticed that her bladder seems full and she sometimes urinates on herself.  She has been taking oxycodone and tizanidine with minimal relief.  The history is provided by the patient.      Past Medical History:  Diagnosis Date   Anemia    50 yrs ago   Cancer [facility])    skin cancer right arm   Heart murmur    prolapsed mitral valve   Osteoporosis     Patient Active Problem List   Diagnosis Date Noted   Closed fracture of superior pubic ramus, right, initial encounter ([place]) 12/02/2020    Past Surgical History:  Procedure Laterality Date   ADENOIDECTOMY     COLONOSCOPY     POLYPECTOMY     right arm fracture     humerus   TONSILLECTOMY AND ADENOIDECTOMY       OB History   No obstetric history on file.     Family History  Problem Relation Age of Onset   Cervical cancer Mother    Heart disease Mother    Lung cancer Father    Colon cancer Neg Hx    Rectal cancer Neg Hx    Stomach cancer Neg Hx     Social  History   Tobacco Use   Smoking status: Former   Smokeless tobacco: Never  Substance Use Topics   Alcohol use: Yes    Comment: occasional   Drug use: No    Home Medications Prior to Admission medications   Medication Sig Start Date End Date Taking? Authorizing Provider  acetaminophen (TYLENOL) 500 MG tablet Take 1 tablet (500 mg total) by mouth every 6 (six) hours as needed for moderate pain or headache. 12/03/20   Domenic Polite, MD  Biotin 1000 MCG tablet Take 1,000 mcg by mouth at bedtime.    [provider]  Calcium Carb-Cholecalciferol 600-800 MG-UNIT TABS Take 1 capsule by mouth 2 (two) times daily before a meal.    [provider]  Cholecalciferol (VITAMIN D-3 PO) Take 1,000 Units by mouth at bedtime.    [provider]  Multiple Vitamins-Minerals (MULTIVITAMIN PO) Take 1 capsule by mouth daily.    [provider]  rosuvastatin (CRESTOR) 10 MG tablet Take 10 mg by mouth every Sunday. 11/27/20   [provider]  senna-docusate (SENOKOT-S) 8.6-50 MG tablet Take 1 tablet by mouth at bedtime as needed for mild constipation. 12/03/20   Domenic Polite, MD    Allergies  Alendronate, Lipitor [atorvastatin], Milk-related compounds, Other, Sudafed [pseudoephedrine hcl], Adhesive [tape], and Caladryl [pramoxine-calamine]  Review of Systems   Review of Systems  Musculoskeletal:  Positive for back pain.       Pelvic pain   All other systems reviewed and are negative.  Physical Exam Updated Vital Signs BP (!) 158/82   Pulse 87   Temp 98.5 F (36.9 C) (Oral)   Resp 12   SpO2 99%   Physical Exam Vitals and nursing note reviewed.  Constitutional:      Comments: Uncomfortable, chronically ill  HENT:     Head: Normocephalic.     Nose: Nose normal.     Mouth/Throat:     Mouth: Mucous membranes are moist.  Eyes:     Extraocular Movements: Extraocular movements intact.     Pupils: Pupils are equal, round, and reactive to light.   Cardiovascular:     Rate and Rhythm: Normal rate and regular rhythm.     Pulses: Normal pulses.     Heart sounds: Normal heart sounds.  Pulmonary:     Effort: Pulmonary effort is normal.     Breath sounds: Normal breath sounds.  Abdominal:     General: Abdomen is flat.     Palpations: Abdomen is soft.  Musculoskeletal:     Cervical back: Normal range of motion and neck supple.     Comments: Tenderness to the right pelvic area and right SI joint.  Also has mild lower lumbar tenderness.  Patient has positive straight leg raise on the left side.  Patient has no saddle anesthesia.  Patient has normal reflexes bilateral knees  Neurological:     General: No focal deficit present.     Mental Status: She is oriented to person, place, and time.  Psychiatric:        Mood and Affect: Mood normal.        Behavior: Behavior normal.    ED Results / Procedures / Treatments   Labs (all labs ordered are listed, but only abnormal results are displayed) Labs Reviewed  CBC WITH DIFFERENTIAL/PLATELET  COMPREHENSIVE METABOLIC PANEL    EKG None  Radiology CT PELVIS WO CONTRAST  Result Date: 12/12/2020 CLINICAL DATA:  Pelvic trauma, fall EXAM: CT PELVIS WITHOUT CONTRAST TECHNIQUE: Multidetector CT imaging of the pelvis was performed following the standard protocol without intravenous contrast. COMPARISON:  CT pelvis 12/10/2020 FINDINGS: Urinary Tract:  No abnormality visualized. Bowel:  Unremarkable visualized pelvic bowel loops. Vascular/Lymphatic: Aortoiliac atherosclerotic calcifications. No aneurysm. No lymphadenopathy. Reproductive:  Unremarkable. Other:  None Musculoskeletal: There are minimally displaced bilateral sacral ala fractures, unchanged from prior exam. There is notable anterior offset at S1-S2 in zone 3, suggesting there is a transverse component through the sacral body (axial image 17, sagittal image 96). No definite zone 2/neural foraminal involvement. Unchanged alignment of the  mildly displaced anterior acetabular/superior pubic root fracture and mildly displaced right inferior pubic ramus fracture. Unchanged mild cortical buckling of the mid iliac wing. Generalized osteopenia. Small extraperitoneal hematoma adjacent to the anterior acetabular fracture, unchanged from the prior exam. There are no new fractures. IMPRESSION: Unchanged alignment of the mildly displaced right anterior acetabular/superior pubic root fracture and mild displaced right inferior pubis ramus fracture. Unchanged minimally displaced bilateral zone 1 sacral ala fractures, and anterior offset at S1-S2 in zone 3 consistent with a transverse component through the sacral body. Unchanged mild cortical buckling of the mid right iliac wing. Electronically Signed   By: Maurine Simmering M.D.   On:  12/12/2020 14:41    Procedures Procedures   Medications Ordered in ED Medications  morphine 4 MG/ML injection 4 mg (4 mg Intravenous Given 12/12/20 1637)    ED Course  I have reviewed the triage vital signs and the nursing notes.  Pertinent labs & imaging results that were available during my care of the patient were reviewed by me and considered in my medical decision making (see chart for details).    MDM Rules/Calculators/A&P                           Carmen Rose is a 80 y.o. female here presenting with worsening pelvic pain and back pain.  Patient also may have incontinence as well.  I reviewed records from her admission as well as recent ED visit.  Patient has pelvic fracture as well as sacral fractures.  I wonder if she is developing cauda equina symptoms.  We will get MRI of the lumbar spine.  We will also consult physical therapy regarding rehab  8:50 PM I talked to social worker and was working on getting patient placement.  Patient does have MRI that showed sacral fractures and also facet arthrosis.  I discussed with Dr. Venetia Constable.  He states that the sacral fractures should be managed by  orthopedic.  He states that the L4-5 arthrosis is unlikely to cause her urinary retention.  Patient received multiple doses of pain medicine and muscle relaxant and Toradol.  Still she is unable to even get up.  At this point, she will be admitted for pain control.  I ordered PT and OT. Neurosurgery to follow in AM.   Final Clinical Impression(s) / ED Diagnoses Final diagnoses:  None    Rx / DC Orders ED Discharge Orders     None        Drenda Freeze, MD 12/12/20 2329

## 2020-12-12 NOTE — H&P (Signed)
History and Physical    Atha Muradyan MVH:[id number] DOB: [birth year] DOA: 12/12/2020  PCP: Ginger Organ., MD  Patient coming from: Home   Chief Complaint: low back, buttock , thigh pain    HPI:    80 year old female with past medical history of osteoporosis, hyperlipidemia, mitral valve prolapse with recent diagnosis of nondisplaced fracture of right superior pubic ramus 11/25 after a fall who presents to [facility] with complaints of severe pelvic, left buttock and left leg pain as well as and urinary incontinence.  Patient was discharged home after a brief hospitalization on 11/25 and 11/26 as a mechanical fall and nondisplaced pelvic pubic ramus fracture.  During that brief hospitalization patient was evaluated by Dr. Victorino December with [facility].  Patient was discharged home with outpatient orthopedic surgery follow-up and was ambulating as tolerated using a walker.    Patient transiently was improving however the morning of 12/4 she suddenly developed severe low back buttock and left thigh pain.  This pain was sharp in quality, radiating from the low back all the way through the left thigh.  Pain is worse with weightbearing with no alleviating factors.  Patient also began to develop some associated urinary incontinence.  This prompted the patient to present to [place] emergency department for evaluation.  Upon evaluation at [facility] the ER provider corresponded with Dr. Stann Mainland once again and discharged patient home with the intent for her to follow-up with Dr. Stann Mainland in clinic on 12/6.  Since then, the patient has continued to experience severe pain of the low back buttocks and left thigh with limited ability to weight-bear and continued instances of urinary incontinence.  Patient presenting once again, this time to [facility] emergency department for evaluation.  Upon this evaluation in the emergency department CT imaging of the pelvis was  performed revealing an unchanged minimally displaced bilateral zone 1 sacral alla fractures and unchanged alignment of the mildly displaced right anterior superior pubic foot fracture.  MRI of the lumbar spine was additionally performed that revealed grade 1 anterolisthesis at L4-L5 with moderate foraminal stenosis.  ER provider discussed case with Dr. Venetia Constable with neurosurgery who did not feel that this finding was attributed to the patient's presentation neurosurgery would follow the patient in the morning.  Hospitalist group was then called to assess the patient for admission the hospital for pain control.   Review of Systems:   Review of Systems  Genitourinary:  Positive for urgency.       Incontinence  Musculoskeletal:  Positive for back pain, falls and joint pain.  All other systems reviewed and are negative.  Past Medical History:  Diagnosis Date   Anemia    50 yrs ago   Cancer ([place])    skin cancer right arm   Heart murmur    prolapsed mitral valve   Osteoporosis     Past Surgical History:  Procedure Laterality Date   ADENOIDECTOMY     COLONOSCOPY     POLYPECTOMY     right arm fracture     humerus   TONSILLECTOMY AND ADENOIDECTOMY       reports that she has quit smoking. She has never used smokeless tobacco. She reports current alcohol use. She reports that she does not use drugs.  Allergies  Allergen Reactions   Alendronate     Flush face    Lipitor [Atorvastatin]     Extreme exhaustion   Milk-Related Compounds Diarrhea    Whole  milk   Other Other (See Comments)    "pain killers" (constipation) Vitamin C(constipation) Anti inflammatory eye drops(swollen throat and vocal cords   Sudafed [Pseudoephedrine Hcl] Other (See Comments)    Vertigo and sleep problems   Adhesive [Tape] Rash   Caladryl [Pramoxine-Calamine] Rash    Contact dermatitis     Family History  Problem Relation Age of Onset   Cervical cancer Mother    Heart disease Mother    Lung  cancer Father    Colon cancer Neg Hx    Rectal cancer Neg Hx    Stomach cancer Neg Hx      Prior to Admission medications   Medication Sig Start Date End Date Taking? Authorizing Provider  acetaminophen (TYLENOL) 500 MG tablet Take 1 tablet (500 mg total) by mouth every 6 (six) hours as needed for moderate pain or headache. 12/03/20  Yes Domenic Polite, MD  Biotin 1000 MCG tablet Take 1,000 mcg by mouth at bedtime.   Yes [provider]  Calcium Carb-Cholecalciferol 600-800 MG-UNIT TABS Take 1 capsule by mouth 2 (two) times daily before a meal.   Yes [provider]  Cholecalciferol (VITAMIN D-3 PO) Take 1,000 Units by mouth at bedtime.   Yes [provider]  Multiple Vitamins-Minerals (MULTIVITAMIN PO) Take 1 capsule by mouth daily.   Yes [provider]  oxyCODONE (OXY IR/ROXICODONE) 5 MG immediate release tablet Take 2.5-5 mg by mouth every 4 (four) hours as needed for moderate pain. 12/10/20  Yes [provider]  rosuvastatin (CRESTOR) 10 MG tablet Take 10 mg by mouth every Sunday. 11/27/20  Yes [provider]  senna-docusate (SENOKOT-S) 8.6-50 MG tablet Take 1 tablet by mouth at bedtime as needed for mild constipation. 12/03/20  Yes Domenic Polite, MD  tizanidine (ZANAFLEX) 2 MG capsule Take 2 mg by mouth 3 (three) times daily as needed for muscle spasms. 12/10/20  Yes [provider]    Physical Exam: Vitals:   12/13/20 0000 12/13/20 0130 12/13/20 0200 12/13/20 0230  BP: 126/63 (!) 142/61 (!) 129/107 (!) 155/63  Pulse: 79 83 74 76  Resp: 12 18 16 15   Temp:      TempSrc:      SpO2: 95% 94% 92% (!) 89%    Constitutional: Awake alert and oriented x3, Patient is in distress due to pain. Skin: no rashes, no lesions, good skin turgor noted. Eyes: Pupils are equally reactive to light.  No evidence of scleral icterus or conjunctival pallor.  ENMT: Moist mucous membranes noted.  Posterior pharynx clear of any exudate or  lesions.   Neck: normal, supple, no masses, no thyromegaly.  No evidence of jugular venous distension.   Respiratory: clear to auscultation bilaterally, no wheezing, no crackles. Normal respiratory effort. No accessory muscle use.  Cardiovascular: Regular rate and rhythm, no murmurs / rubs / gallops. No extremity edema. 2+ pedal pulses. No carotid bruits.  Chest:   Nontender without crepitus or deformity.   Back:   Some tenderness of the lumbar spine without crepitus or deformity. Abdomen: Abdomen is soft and nontender.  No evidence of intra-abdominal masses.  Positive bowel sounds noted in all quadrants.   Musculoskeletal:  Exquisite tenderness of the left buttock and left posterior thigh without associated deformities.  No pain could be reproduced with isolation of the hip joints.   Good ROM, no contractures. Normal muscle tone.  Neurologic: CN 2-12 grossly intact. Sensation intact.  Patient moving all 4 extremities spontaneously.  Patient is following all  commands.  Patient is responsive to verbal stimuli.   Psychiatric: Patient exhibits normal mood with appropriate affect.  Patient seems to possess insight as to their current situation.     Labs on Admission: I have personally reviewed following labs and imaging studies -   CBC: Recent Labs  Lab 12/12/20 1646 12/13/20 0306  WBC 9.5 6.9  NEUTROABS 6.8 4.4  HGB 15.5* 14.5  HCT 45.5 43.8  MCV 92.7 94.8  PLT 474* 329*   Basic Metabolic Panel: Recent Labs  Lab 12/12/20 1646 12/13/20 0306  NA 139 137  K 3.3* 3.4*  CL 104 102  CO2 22 25  GLUCOSE 104* 108*  BUN 8 9  CREATININE 0.84 0.94  CALCIUM 9.3 8.9  MG  --  2.3   GFR: Estimated Creatinine Clearance: 39.9 mL/min (by C-G formula based on SCr of 0.94 mg/dL). Liver Function Tests: Recent Labs  Lab 12/12/20 1646 12/13/20 0306  AST 29 23  ALT 23 21  ALKPHOS 86 83  BILITOT 0.9 0.5  PROT 6.7 6.2*  ALBUMIN 3.7 3.2*   No results for input(s): LIPASE, AMYLASE in the last  168 hours. No results for input(s): AMMONIA in the last 168 hours. Coagulation Profile: No results for input(s): INR, PROTIME in the last 168 hours. Cardiac Enzymes: No results for input(s): CKTOTAL, CKMB, CKMBINDEX, TROPONINI in the last 168 hours. BNP (last 3 results) No results for input(s): PROBNP in the last 8760 hours. HbA1C: No results for input(s): HGBA1C in the last 72 hours. CBG: No results for input(s): GLUCAP in the last 168 hours. Lipid Profile: No results for input(s): CHOL, HDL, LDLCALC, TRIG, CHOLHDL, LDLDIRECT in the last 72 hours. Thyroid Function Tests: No results for input(s): TSH, T4TOTAL, FREET4, T3FREE, THYROIDAB in the last 72 hours. Anemia Panel: No results for input(s): VITAMINB12, FOLATE, FERRITIN, TIBC, IRON, RETICCTPCT in the last 72 hours. Urine analysis:    Component Value Date/Time   COLORURINE YELLOW 12/10/2020 0428   APPEARANCEUR CLEAR 12/10/2020 0428   LABSPEC 1.009 12/10/2020 0428   PHURINE 6.5 12/10/2020 0428   GLUCOSEU NEGATIVE 12/10/2020 0428   HGBUR NEGATIVE 12/10/2020 0428   BILIRUBINUR NEGATIVE 12/10/2020 0428   KETONESUR TRACE (A) 12/10/2020 0428   PROTEINUR NEGATIVE 12/10/2020 0428   UROBILINOGEN 0.2 10/13/2008 1011   NITRITE NEGATIVE 12/10/2020 0428   LEUKOCYTESUR NEGATIVE 12/10/2020 0428    Radiological Exams on Admission - Personally Reviewed: CT PELVIS WO CONTRAST  Result Date: 12/12/2020 CLINICAL DATA:  Pelvic trauma, fall EXAM: CT PELVIS WITHOUT CONTRAST TECHNIQUE: Multidetector CT imaging of the pelvis was performed following the standard protocol without intravenous contrast. COMPARISON:  CT pelvis 12/10/2020 FINDINGS: Urinary Tract:  No abnormality visualized. Bowel:  Unremarkable visualized pelvic bowel loops. Vascular/Lymphatic: Aortoiliac atherosclerotic calcifications. No aneurysm. No lymphadenopathy. Reproductive:  Unremarkable. Other:  None Musculoskeletal: There are minimally displaced bilateral sacral ala fractures,  unchanged from prior exam. There is notable anterior offset at S1-S2 in zone 3, suggesting there is a transverse component through the sacral body (axial image 17, sagittal image 96). No definite zone 2/neural foraminal involvement. Unchanged alignment of the mildly displaced anterior acetabular/superior pubic root fracture and mildly displaced right inferior pubic ramus fracture. Unchanged mild cortical buckling of the mid iliac wing. Generalized osteopenia. Small extraperitoneal hematoma adjacent to the anterior acetabular fracture, unchanged from the prior exam. There are no new fractures. IMPRESSION: Unchanged alignment of the mildly displaced right anterior acetabular/superior pubic root fracture and mild displaced right inferior pubis ramus fracture. Unchanged minimally  displaced bilateral zone 1 sacral ala fractures, and anterior offset at S1-S2 in zone 3 consistent with a transverse component through the sacral body. Unchanged mild cortical buckling of the mid right iliac wing. Electronically Signed   By: Maurine Simmering M.D.   On: 12/12/2020 14:41   MR LUMBAR SPINE WO CONTRAST  Result Date: 12/12/2020 CLINICAL DATA:  Fall 1 week ago.  Left buttock and leg pain EXAM: MRI LUMBAR SPINE WITHOUT CONTRAST TECHNIQUE: Multiplanar, multisequence MR imaging of the lumbar spine was performed. No intravenous contrast was administered. COMPARISON:  CT pelvis 12/12/2020 FINDINGS: Segmentation:  Standard. Alignment:  Grade 1 anterolisthesis at L4-5 Vertebrae: There is hyperintense T2-weighted signal within the bone marrow at the S1-2 level, and at both sacral ala. This corresponds to known fractures demonstrated on earlier CT of the pelvis. Conus medullaris and cauda equina: Conus extends to the L2 level. Conus and cauda equina appear normal. Paraspinal and other soft tissues: 9 mm common bile duct Disc levels: L1-L2: Normal disc space and facet joints. No spinal canal stenosis. No neural foraminal stenosis. L2-L3: Normal  disc space and facet joints. No spinal canal stenosis. No neural foraminal stenosis. L3-L4: Normal disc space and facet joints. No spinal canal stenosis. No neural foraminal stenosis. L4-L5: Medium-sized disc bulge, right asymmetric. Moderate facet hypertrophy with bilateral lateral projecting synovial cysts. Both spinal canal stenosis. Moderate right and mild left neural foraminal stenosis. L5-S1: Normal disc space and facet joints. No spinal canal stenosis. No neural foraminal stenosis. Visualized sacrum: Normal. IMPRESSION: 1. Bone marrow edema at the S1-2 level, and at both sacral ala, consistent with known fractures demonstrated on earlier CT of the pelvis. 2. Grade 1 anterolisthesis at L4-L5 secondary to moderate facet arthrosis. There is moderate right and mild left neural foraminal stenosis at this level. Electronically Signed   By: Ulyses Jarred M.D.   On: 12/12/2020 20:31   DG Abd 2 Views  Result Date: 12/12/2020 CLINICAL DATA:  Constipation. Fall 1 week ago with known pelvic fractures. EXAM: ABDOMEN - 2 VIEW COMPARISON:  12/12/2020. FINDINGS: The bowel gas pattern is normal. A mild amount of retained stool is present in the colon. There is no evidence of free air. No radio-opaque calculi. Vascular calcifications are noted in the abdomen. There is mild atelectasis at the left lung base. There is bony defect of the acetabulum and inferior pubic ramus on the right, compatible with known fractures. Sacral alar fractures are also noted on the right and not well seen on the left. IMPRESSION: No bowel obstruction or free air. Mild amount of retained stool in the colon. Electronically Signed   By: Brett Fairy M.D.   On: 12/12/2020 22:38      Assessment/Plan  * Left buttock pain Patient presenting with exquisite pain of the left buttock and posterior thigh that seems to have evolved from patient's original pain she experienced after her traumatic fall resulting in her pelvic/sacral fractures on  11/25. Pain has progressively worsened despite conservative management over the past 1 1/2 weeks and is now seemingly intractable with patient unable to ambulate as a result Exact cause of pain is not entirely clear but is likely being caused as a result of patient's pubic ramus and sacral fractures.  I am concerned the patient has developed a lumbar plexopathy as a result.   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  After speaking to the  ER provider Neurosurgery has agreed to come by in the AM to evaluate further and I recommend their assistance.    Placing patient on a temporary regimen of scheduled NSAIDS (also starting a PPI) with adidtional PRN opiate based analgesics and muscle relaxants. Order placed for IR consultation to determine if there are any avenues from their perspective to help address her substantial pain - will keep her NPO until they can opine on the case. PT eval ordered for the AM, patient may need SNF placement Obtaining vitamin D level, will start supplementation if indicated   Sacral fracture, closed ([place]) Please see assessment and plan above.  Urinary incontinence Urinary incontinence is a well documented complication of pelvic fractures and hopefully is self limiting.   Using a purewick in the meantime Bladder scan has identified no evidence of urinary retention  Closed fracture of superior pubic ramus, right, initial encounter ([place]) Please see assessment and plan above.  Anterolisthesis of lumbar spine Please see assessment and plan above.    Mixed hyperlipidemia Continuing home regimen of lipid lowering therapy.      Code Status:  Full code  code status decision has been confirmed with: patient Family Communication: deferred   Status is: Inpatient  Patient is suffering from intractable pain and inability to ambulate due to multiple  fractures of the pelvis and sacrum requiring dosing of opiate based analgesics and expert consultation with ongoing workup.         Vernelle Emerald MD [facility] Pager [phone]  If 7PM-7AM, please contact night-coverage [email or web address] Use universal [place] password for that web site. If you do not have the password, please call the hospital operator.  12/13/2020, 4:23 AM

## 2020-12-12 NOTE — Care Management (Addendum)
PT consult placed.

## 2020-12-13 ENCOUNTER — Encounter (HOSPITAL_COMMUNITY): Payer: Self-pay | Admitting: Internal Medicine

## 2020-12-13 DIAGNOSIS — S3210XA Unspecified fracture of sacrum, initial encounter for closed fracture: Secondary | ICD-10-CM

## 2020-12-13 DIAGNOSIS — M5416 Radiculopathy, lumbar region: Secondary | ICD-10-CM | POA: Diagnosis present

## 2020-12-13 DIAGNOSIS — M48061 Spinal stenosis, lumbar region without neurogenic claudication: Secondary | ICD-10-CM | POA: Diagnosis present

## 2020-12-13 DIAGNOSIS — R32 Unspecified urinary incontinence: Secondary | ICD-10-CM | POA: Diagnosis present

## 2020-12-13 DIAGNOSIS — M7918 Myalgia, other site: Secondary | ICD-10-CM | POA: Diagnosis not present

## 2020-12-13 DIAGNOSIS — S32591A Other specified fracture of right pubis, initial encounter for closed fracture: Secondary | ICD-10-CM | POA: Diagnosis present

## 2020-12-13 DIAGNOSIS — S32119A Unspecified Zone I fracture of sacrum, initial encounter for closed fracture: Secondary | ICD-10-CM | POA: Diagnosis present

## 2020-12-13 DIAGNOSIS — B961 Klebsiella pneumoniae [K. pneumoniae] as the cause of diseases classified elsewhere: Secondary | ICD-10-CM | POA: Diagnosis present

## 2020-12-13 DIAGNOSIS — Z79899 Other long term (current) drug therapy: Secondary | ICD-10-CM | POA: Diagnosis not present

## 2020-12-13 DIAGNOSIS — W1830XA Fall on same level, unspecified, initial encounter: Secondary | ICD-10-CM | POA: Diagnosis present

## 2020-12-13 DIAGNOSIS — Z85828 Personal history of other malignant neoplasm of skin: Secondary | ICD-10-CM | POA: Diagnosis not present

## 2020-12-13 DIAGNOSIS — Z91011 Allergy to milk products: Secondary | ICD-10-CM | POA: Diagnosis not present

## 2020-12-13 DIAGNOSIS — Z91048 Other nonmedicinal substance allergy status: Secondary | ICD-10-CM | POA: Diagnosis not present

## 2020-12-13 DIAGNOSIS — I10 Essential (primary) hypertension: Secondary | ICD-10-CM | POA: Diagnosis present

## 2020-12-13 DIAGNOSIS — R636 Underweight: Secondary | ICD-10-CM | POA: Diagnosis present

## 2020-12-13 DIAGNOSIS — Z20822 Contact with and (suspected) exposure to covid-19: Secondary | ICD-10-CM | POA: Diagnosis present

## 2020-12-13 DIAGNOSIS — K5900 Constipation, unspecified: Secondary | ICD-10-CM | POA: Diagnosis present

## 2020-12-13 DIAGNOSIS — I341 Nonrheumatic mitral (valve) prolapse: Secondary | ICD-10-CM | POA: Diagnosis present

## 2020-12-13 DIAGNOSIS — S32511A Fracture of superior rim of right pubis, initial encounter for closed fracture: Secondary | ICD-10-CM | POA: Diagnosis not present

## 2020-12-13 DIAGNOSIS — M810 Age-related osteoporosis without current pathological fracture: Secondary | ICD-10-CM | POA: Diagnosis present

## 2020-12-13 DIAGNOSIS — E782 Mixed hyperlipidemia: Secondary | ICD-10-CM | POA: Diagnosis present

## 2020-12-13 DIAGNOSIS — S32139A Unspecified Zone III fracture of sacrum, initial encounter for closed fracture: Secondary | ICD-10-CM | POA: Diagnosis present

## 2020-12-13 DIAGNOSIS — N390 Urinary tract infection, site not specified: Secondary | ICD-10-CM | POA: Diagnosis present

## 2020-12-13 DIAGNOSIS — Z681 Body mass index (BMI) 19 or less, adult: Secondary | ICD-10-CM | POA: Diagnosis not present

## 2020-12-13 DIAGNOSIS — Z87891 Personal history of nicotine dependence: Secondary | ICD-10-CM | POA: Diagnosis not present

## 2020-12-13 DIAGNOSIS — Z888 Allergy status to other drugs, medicaments and biological substances status: Secondary | ICD-10-CM | POA: Diagnosis not present

## 2020-12-13 DIAGNOSIS — M4316 Spondylolisthesis, lumbar region: Secondary | ICD-10-CM | POA: Diagnosis not present

## 2020-12-13 LAB — URINALYSIS, ROUTINE W REFLEX MICROSCOPIC
Bilirubin Urine: NEGATIVE
Glucose, UA: NEGATIVE mg/dL
Hgb urine dipstick: NEGATIVE
Ketones, ur: 15 mg/dL — AB
Nitrite: POSITIVE — AB
Protein, ur: NEGATIVE mg/dL
Specific Gravity, Urine: 1.01 (ref 1.005–1.030)
pH: 6 (ref 5.0–8.0)

## 2020-12-13 LAB — CBC WITH DIFFERENTIAL/PLATELET
Abs Immature Granulocytes: 0.03 10*3/uL (ref 0.00–0.07)
Basophils Absolute: 0.1 10*3/uL (ref 0.0–0.1)
Basophils Relative: 1 %
Eosinophils Absolute: 0.2 10*3/uL (ref 0.0–0.5)
Eosinophils Relative: 3 %
HCT: 43.8 % (ref 36.0–46.0)
Hemoglobin: 14.5 g/dL (ref 12.0–15.0)
Immature Granulocytes: 0 %
Lymphocytes Relative: 18 %
Lymphs Abs: 1.3 10*3/uL (ref 0.7–4.0)
MCH: 31.4 pg (ref 26.0–34.0)
MCHC: 33.1 g/dL (ref 30.0–36.0)
MCV: 94.8 fL (ref 80.0–100.0)
Monocytes Absolute: 1 10*3/uL (ref 0.1–1.0)
Monocytes Relative: 14 %
Neutro Abs: 4.4 10*3/uL (ref 1.7–7.7)
Neutrophils Relative %: 64 %
Platelets: 428 10*3/uL — ABNORMAL HIGH (ref 150–400)
RBC: 4.62 MIL/uL (ref 3.87–5.11)
RDW: 13.3 % (ref 11.5–15.5)
WBC: 6.9 10*3/uL (ref 4.0–10.5)
nRBC: 0 % (ref 0.0–0.2)

## 2020-12-13 LAB — URINALYSIS, MICROSCOPIC (REFLEX)

## 2020-12-13 LAB — COMPREHENSIVE METABOLIC PANEL
ALT: 21 U/L (ref 0–44)
AST: 23 U/L (ref 15–41)
Albumin: 3.2 g/dL — ABNORMAL LOW (ref 3.5–5.0)
Alkaline Phosphatase: 83 U/L (ref 38–126)
Anion gap: 10 (ref 5–15)
BUN: 9 mg/dL (ref 8–23)
CO2: 25 mmol/L (ref 22–32)
Calcium: 8.9 mg/dL (ref 8.9–10.3)
Chloride: 102 mmol/L (ref 98–111)
Creatinine, Ser: 0.94 mg/dL (ref 0.44–1.00)
GFR, Estimated: >60 mL/min (ref >60)
Glucose, Bld: 108 mg/dL — ABNORMAL HIGH (ref 70–99)
Potassium: 3.4 mmol/L — ABNORMAL LOW (ref 3.5–5.1)
Sodium: 137 mmol/L (ref 135–145)
Total Bilirubin: 0.5 mg/dL (ref 0.3–1.2)
Total Protein: 6.2 g/dL — ABNORMAL LOW (ref 6.5–8.1)

## 2020-12-13 LAB — MAGNESIUM: Magnesium: 2.3 mg/dL (ref 1.7–2.4)

## 2020-12-13 LAB — RESP PANEL BY RT-PCR (FLU A&B, COVID) ARPGX2
Influenza A by PCR: NEGATIVE
Influenza B by PCR: NEGATIVE
SARS Coronavirus 2 by RT PCR: NEGATIVE

## 2020-12-13 MED ORDER — KETOROLAC TROMETHAMINE 30 MG/ML IJ SOLN
30.0000 mg | Freq: Once | INTRAMUSCULAR | Status: AC
Start: 2020-12-13 — End: 2020-12-13
  Administered 2020-12-13: 30 mg via INTRAVENOUS
  Filled 2020-12-13: qty 1

## 2020-12-13 MED ORDER — MORPHINE SULFATE (PF) 4 MG/ML IV SOLN
4.0000 mg | INTRAVENOUS | Status: DC | PRN
  Administered 2020-12-13 – 2020-12-15 (×6): 4 mg via INTRAVENOUS
  Filled 2020-12-13 (×10): qty 1

## 2020-12-13 MED ORDER — MORPHINE SULFATE (PF) 2 MG/ML IV SOLN
2.0000 mg | INTRAVENOUS | Status: DC | PRN
  Administered 2020-12-13: 2 mg via INTRAVENOUS
  Filled 2020-12-13: qty 1

## 2020-12-13 MED ORDER — POTASSIUM CHLORIDE CRYS ER 20 MEQ PO TBCR
40.0000 meq | EXTENDED_RELEASE_TABLET | Freq: Once | ORAL | Status: AC
  Administered 2020-12-13: 40 meq via ORAL
  Filled 2020-12-13: qty 2

## 2020-12-13 MED ORDER — NAPROXEN 250 MG PO TABS
500.0000 mg | ORAL_TABLET | Freq: Two times a day (BID) | ORAL | Status: DC
  Administered 2020-12-13 – 2020-12-19 (×12): 500 mg via ORAL
  Filled 2020-12-13 (×13): qty 2

## 2020-12-13 MED ORDER — MORPHINE SULFATE (PF) 2 MG/ML IV SOLN
2.0000 mg | Freq: Once | INTRAVENOUS | Status: DC
  Filled 2020-12-13: qty 1

## 2020-12-13 MED ORDER — PANTOPRAZOLE SODIUM 40 MG PO TBEC
40.0000 mg | DELAYED_RELEASE_TABLET | Freq: Every day | ORAL | Status: DC
  Administered 2020-12-13 – 2020-12-18 (×6): 40 mg via ORAL
  Filled 2020-12-13 (×6): qty 1

## 2020-12-13 MED ORDER — OXYCODONE-ACETAMINOPHEN 5-325 MG PO TABS
1.0000 | ORAL_TABLET | ORAL | Status: DC | PRN
  Administered 2020-12-13 – 2020-12-15 (×5): 1 via ORAL
  Filled 2020-12-13 (×6): qty 1

## 2020-12-13 MED ORDER — OXYCODONE-ACETAMINOPHEN 5-325 MG PO TABS: 1.0000 | ORAL_TABLET | ORAL | Status: DC | PRN

## 2020-12-13 NOTE — Assessment & Plan Note (Signed)
·   Please see assessment and plan above °

## 2020-12-13 NOTE — Assessment & Plan Note (Addendum)
   Patient presenting with exquisite pain of the left buttock and posterior thigh that seems to have evolved from patient's original pain she experienced after her traumatic fall resulting in her pelvic/sacral fractures on 11/25.  Pain has progressively worsened despite conservative management over the past 1 1/2 weeks and is now seemingly intractable with patient unable to ambulate as a result  Exact cause of pain is not entirely clear but is likely being caused as a result of patient's pubic ramus and sacral fractures.  I am concerned the patient has developed a lumbar plexopathy as a result.    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  After speaking to the ER provider Neurosurgery has agreed to come by in the AM to evaluate further and I recommend their assistance.     Placing patient on a temporary regimen of scheduled NSAIDS (also starting a PPI) with adidtional PRN opiate based analgesics and muscle relaxants.  Order placed for IR consultation to determine if there are any avenues from their perspective to help address her substantial pain - will keep her NPO until they can opine on the case.  PT eval ordered for the AM, patient may need SNF placement  Obtaining vitamin D level, will start supplementation if indicated

## 2020-12-13 NOTE — Evaluation (Signed)
Physical Therapy Evaluation Patient Details Name: Carmen Rose MRN: [id number] DOB: 01-31-1940 Today's Date: 12/13/2020  History of Present Illness  Pt is an 80 y/o female admitted secondary to increased pain on the L. Recent R pelvic fx. PMH includes anemia.  Clinical Impression  Pt admitted secondary to problem above with deficits below. Was able to stand with min guard A, however, was unable to take steps this session secondary to pain. Prior to admission, pt was able to ambulate using RW and had been mod I with ADL tasks. Pt currently lives alone and at increased risk for falls. Recommending SNF level therapies at d/c. Will continue to follow acutely.      Recommendations for follow up therapy are one component of a multi-disciplinary discharge planning process, led by the attending physician.  Recommendations may be updated based on patient status, additional functional criteria and insurance authorization.  Follow Up Recommendations Skilled nursing-short term rehab (<3 hours/day)    Assistance Recommended at Discharge Frequent or constant Supervision/Assistance  Functional Status Assessment Patient has had a recent decline in their functional status and demonstrates the ability to make significant improvements in function in a reasonable and predictable amount of time.  Equipment Recommendations  None recommended by PT    Recommendations for Other Services       Precautions / Restrictions Precautions Precautions: Fall Restrictions Weight Bearing Restrictions: Yes RLE Weight Bearing: Weight bearing as tolerated      Mobility  Bed Mobility Overal bed mobility: Modified Independent                  Transfers Overall transfer level: Needs assistance Equipment used: Rolling walker (2 wheels) Transfers: Sit to/from Stand Sit to Stand: Min guard           General transfer comment: Min guard A for safety. Attempted to take steps, but unable secondary to  pain    Ambulation/Gait                  Stairs            Wheelchair Mobility    Modified Rankin (Stroke Patients Only)       Balance Overall balance assessment: Needs assistance Sitting-balance support: No upper extremity supported Sitting balance-Leahy Scale: Good     Standing balance support: Bilateral upper extremity supported Standing balance-Leahy Scale: Poor Standing balance comment: REliant on BUE support                             Pertinent Vitals/Pain Pain Assessment: Faces Faces Pain Scale: Hurts even more Pain Location: BLE Pain Descriptors / Indicators: Grimacing;Guarding Pain Intervention(s): Limited activity within patient's tolerance;Monitored during session;Repositioned    Home Living Family/patient expects to be discharged to:: Private residence Living Arrangements: Alone Available Help at Discharge: Friend(s);Available PRN/intermittently Type of Home: House Home Access: Stairs to enter Entrance Stairs-Rails: None Entrance Stairs-Number of Steps: 2   Home Layout: One level Home Equipment: Public relations account executive (2 wheels);Wheelchair - manual      Prior Function Prior Level of Function : Independent/Modified Independent             Mobility Comments: Was using RW for mobility ADLs Comments: HAs been sponge bathing     Hand Dominance        Extremity/Trunk Assessment   Upper Extremity Assessment Upper Extremity Assessment: Overall WFL for tasks assessed    Lower Extremity Assessment Lower Extremity Assessment: Generalized weakness (ROM  WFL, but unable to take steps)    Cervical / Trunk Assessment Cervical / Trunk Assessment: Normal  Communication   Communication: No difficulties  Cognition Arousal/Alertness: Awake/alert Behavior During Therapy: WFL for tasks assessed/performed Overall Cognitive Status: Within Functional Limits for tasks assessed                                           General Comments      Exercises     Assessment/Plan    PT Assessment Patient needs continued PT services  PT Problem List Decreased strength;Decreased mobility;Decreased safety awareness;Decreased activity tolerance;Decreased balance;Decreased knowledge of use of DME;Pain       PT Treatment Interventions DME instruction;Therapeutic exercise;Gait training;Balance training;Stair training;Functional mobility training;Therapeutic activities;Patient/family education    PT Goals (Current goals can be found in the Care Plan section)  Acute Rehab PT Goals Patient Stated Goal: to be independent PT Goal Formulation: With patient Time For Goal Achievement: 12/27/20 Potential to Achieve Goals: Good    Frequency Min 2X/week   Barriers to discharge Decreased caregiver support;Inaccessible home environment      Co-evaluation               AM-PAC PT "6 Clicks" Mobility  Outcome Measure Help needed turning from your back to your side while in a flat bed without using bedrails?: None Help needed moving from lying on your back to sitting on the side of a flat bed without using bedrails?: None Help needed moving to and from a bed to a chair (including a wheelchair)?: A Lot Help needed standing up from a chair using your arms (e.g., wheelchair or bedside chair)?: A Little Help needed to walk in hospital room?: Total Help needed climbing 3-5 steps with a railing? : Total 6 Click Score: 15    End of Session Equipment Utilized During Treatment: Gait belt Activity Tolerance: Patient limited by pain Patient left: in bed;with call bell/phone within reach (on stretcher in ED) Nurse Communication: Mobility status PT Visit Diagnosis: Other abnormalities of gait and mobility (R26.89);History of falling (Z91.81);Pain Pain - Right/Left: Right Pain - part of body: Hip    Time: 1200-1221 PT Time Calculation (min) (ACUTE ONLY): 21 min   Charges:   PT Evaluation $PT Eval Moderate  Complexity: 1 Mod          Reuel Derby, PT, DPT  Acute Rehabilitation Services  Pager: [phone] Office: [phone]   Rudean Hitt 12/13/2020, 1:59 PM

## 2020-12-13 NOTE — ED Notes (Signed)
Admit provider at bedside 

## 2020-12-13 NOTE — ED Notes (Signed)
Received verbal report from Joshua N RN at this time 

## 2020-12-13 NOTE — Consult Note (Signed)
Neurosurgery Consultation  Reason for Consult: back pain Referring Physician: Shalhoub  CC: buttock pain  HPI: This is a 80 y.o. woman that presents with severe buttock pain and some urinary retention. She was recently admitted for a pelvic raums fracture, was reportedly doing well with worsening of her pain 3 days ago. It is located in the buttock and below the intercristal plane without radiation during my examination. No new weakness, numbness, or parasthesias, no recent change in bowel or bladder function. No radicular pain, does have some chronic low back pain if she does too much yard / house work that has been stable for years and feels different to this. 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    ROS: A 14 point ROS was performed and is negative except as noted in the HPI.   PMHx:  Past Medical History:  Diagnosis Date   Anemia    53 yrs ago   Cancer ([place])    skin cancer right arm   Heart murmur    prolapsed mitral valve   Osteoporosis    FamHx:  Family History  Problem Relation Age of Onset   Cervical cancer Mother    Heart disease Mother    Lung cancer Father    Colon cancer Neg Hx    Rectal cancer Neg Hx    Stomach cancer Neg Hx    SocHx:  reports that she has quit smoking. She has never used smokeless tobacco. She reports current alcohol use. She reports that she does not use drugs.  Exam: Vital signs in last 24 hours: Temp:  [98.5 F (36.9 C)] 98.5 F (36.9 C) (12/06 1248) Pulse Rate:  [74-97] 88 (12/07 0423) Resp:  [12-29] 29 (12/07 0423) BP: (126-173)/(61-107) 157/77 (12/07 0423) SpO2:  [89 %-100 %] 94 % (12/07 0423) Weight:  [53 kg] 53 kg (12/07 0425) General: Awake, alert, cooperative, lying in bed in NAD Head: Normocephalic and atruamatic HEENT: Neck supple Pulmonary: breathing room air comfortably, no evidence of increased work of breathing Cardiac: RRR Abdomen: S NT ND Extremities: Warm and well perfused  x4 Neuro: AOx3, PERRL, EOMI, FS Strength 5/5 x4 with some proximal BLE pain limitation but appears full strength, SILTx4   Assessment and Plan: 80 y.o. woman with h/o osteoporosis, recent pubic ramus fracture with worsening LBP, urinary retention. MRI L-spine personally reviewed, which shows L4-5 grade 1 spondy, likely degenerative, diffuse T2 changes in the sacrum c/w sacral fractures. At L4-5 there is some moderate right foraminal stenosis and small synovial cysts.   -no acute neurosurgical intervention indicated at this time, pain is much more c/w her severe sacral fracture, no radiologic evidence of neurologic compromise that would case urinary dysfunction, much more likely pain related. I discussed with her that she can follow up with me for her lumbar spondylolisthesis if it continues to bother her after her sacral fractures heal. Defer treatment of those to primary / orthopedics.  -please call with any concerns or questions  Judith Part, MD 12/13/20 6:08 AM [facility]

## 2020-12-13 NOTE — ED Notes (Signed)
Per pt the ice pack worked and the pain is gone. Current pain med order not given

## 2020-12-13 NOTE — H&P (Signed)
Chief Complaint: Buttock pain  Referring Physician(s): Inda Merlin  Supervising Physician: Pedro Earls  Patient Status: [facility] - In-pt  History of Present Illness: Carmen Rose is a 80 y.o. female with past medical history of osteoporosis, hyperlipidemia, mitral valve prolapse.  She has recent diagnosis of nondisplaced fracture of right superior pubic ramus 11/25 after a fall.  She presented to [facility] with complaints of severe pelvic, left buttock and left leg pain as well as and urinary incontinence.   She was discharged home after a brief hospitalization on 11/25 and 11/26 as a mechanical fall and nondisplaced pelvic pubic ramus fracture.    During that brief hospitalization patient was evaluated by Dr. Victorino December with [facility].    Patient was discharged home with outpatient orthopedic surgery follow-up and was ambulating as tolerated using a walker.     The morning of 12/4 she suddenly developed severe low back buttock and left thigh pain.    She rated it a 9.5 out of 10 and states it radiates from the low back all the way through the left thigh.  Pain is worse with weightbearing and any movement in the with no alleviating factors.    She has continued to experience severe pain of the low back buttocks and left thigh with limited ability to weight-bear and continued instances of urinary incontinence.  She unable to manage her pain so she presented again to the emergency department for evaluation.  CT imaging of the pelvis was performed revealing an unchanged minimally displaced bilateral zone 1 sacral alla fractures and unchanged alignment of the mildly displaced right anterior superior pubic foot fracture.    MRI of the lumbar spine was additionally performed that revealed grade 1 anterolisthesis at L4-L5 with moderate foraminal stenosis.    The hospitalist group was then called to assess the patient for admission the  hospital for pain control.  IR is asked to evaluate images and see if there is anything we can offer for pain relief.  She tells me she cannot stand or move at all. She tried to get up with physical therapy earlier today and she was unable to take any steps due to severe pain.   She tells me her pain is controlled somewhat while she is in bed and not moving, but any movement and she immediately has severe 9/10 pain.    Past Medical History:  Diagnosis Date   Anemia    50 yrs ago   Cancer ([place])    skin cancer right arm   Heart murmur    prolapsed mitral valve   Osteoporosis     Past Surgical History:  Procedure Laterality Date   ADENOIDECTOMY     COLONOSCOPY     POLYPECTOMY     right arm fracture     humerus   TONSILLECTOMY AND ADENOIDECTOMY      Allergies: Alendronate, Lipitor [atorvastatin], Milk-related compounds, Other, Sudafed [pseudoephedrine hcl], Adhesive [tape], and Caladryl [pramoxine-calamine]  Medications: Prior to Admission medications   Medication Sig Start Date End Date Taking? Authorizing Provider  acetaminophen (TYLENOL) 500 MG tablet Take 1 tablet (500 mg total) by mouth every 6 (six) hours as needed for moderate pain or headache. 12/03/20  Yes Domenic Polite, MD  Biotin 1000 MCG tablet Take 1,000 mcg by mouth at bedtime.   Yes [provider]  Calcium Carb-Cholecalciferol 600-800 MG-UNIT TABS Take 1 capsule by mouth 2 (two) times daily before a meal.   Yes  [provider]  Cholecalciferol (VITAMIN D-3 PO) Take 1,000 Units by mouth at bedtime.   Yes [provider]  Multiple Vitamins-Minerals (MULTIVITAMIN PO) Take 1 capsule by mouth daily.   Yes [provider]  oxyCODONE (OXY IR/ROXICODONE) 5 MG immediate release tablet Take 2.5-5 mg by mouth every 4 (four) hours as needed for moderate pain. 12/10/20  Yes [provider]  rosuvastatin (CRESTOR) 10 MG tablet Take 10 mg by mouth every Sunday. 11/27/20  Yes  [provider]  senna-docusate (SENOKOT-S) 8.6-50 MG tablet Take 1 tablet by mouth at bedtime as needed for mild constipation. 12/03/20  Yes Domenic Polite, MD  tizanidine (ZANAFLEX) 2 MG capsule Take 2 mg by mouth 3 (three) times daily as needed for muscle spasms. 12/10/20  Yes [provider]     Family History  Problem Relation Age of Onset   Cervical cancer Mother    Heart disease Mother    Lung cancer Father    Colon cancer Neg Hx    Rectal cancer Neg Hx    Stomach cancer Neg Hx     Social History   Socioeconomic History   Marital status: Single    Spouse name: Not on file   Number of children: Not on file   Years of education: Not on file   Highest education level: Not on file  Occupational History   Not on file  Tobacco Use   Smoking status: Former   Smokeless tobacco: Never  Vaping Use   Vaping Use: Not on file  Substance and Sexual Activity   Alcohol use: Yes    Comment: occasional   Drug use: No   Sexual activity: Not on file  Other Topics Concern   Not on file  Social History Narrative   Not on file   Social Determinants of Health   Financial Resource Strain: Not on file  Food Insecurity: Not on file  Transportation Needs: Not on file  Physical Activity: Not on file  Stress: Not on file  Social Connections: Not on file     Review of Systems: A 12 point ROS discussed and pertinent positives are indicated in the HPI above.  All other systems are negative.  Review of Systems  Vital Signs: BP (!) 153/82   Pulse 78   Temp 98.5 F (36.9 C) (Oral)   Resp 19   Ht 5\' 7"  (1.702 m)   Wt 53 kg   SpO2 97%   BMI 18.30 kg/m   Physical Exam Vitals reviewed.  Constitutional:      Appearance: Normal appearance.  Cardiovascular:     Rate and Rhythm: Normal rate and regular rhythm.  Pulmonary:     Effort: Pulmonary effort is normal. No respiratory distress.     Breath sounds: Normal breath sounds.  Musculoskeletal:       Back:      Comments: Unable to assess movement due to severe pain with any movement at all  Neurological:     General: No focal deficit present.     Mental Status: She is alert and oriented to person, place, and time.  Psychiatric:        Mood and Affect: Mood normal.        Behavior: Behavior normal.        Thought Content: Thought content normal.        Judgment: Judgment normal.    Imaging: CT PELVIS WO CONTRAST  Result Date: 12/12/2020 CLINICAL DATA:  Pelvic trauma, fall EXAM:  CT PELVIS WITHOUT CONTRAST TECHNIQUE: Multidetector CT imaging of the pelvis was performed following the standard protocol without intravenous contrast. COMPARISON:  CT pelvis 12/10/2020 FINDINGS: Urinary Tract:  No abnormality visualized. Bowel:  Unremarkable visualized pelvic bowel loops. Vascular/Lymphatic: Aortoiliac atherosclerotic calcifications. No aneurysm. No lymphadenopathy. Reproductive:  Unremarkable. Other:  None Musculoskeletal: There are minimally displaced bilateral sacral ala fractures, unchanged from prior exam. There is notable anterior offset at S1-S2 in zone 3, suggesting there is a transverse component through the sacral body (axial image 17, sagittal image 96). No definite zone 2/neural foraminal involvement. Unchanged alignment of the mildly displaced anterior acetabular/superior pubic root fracture and mildly displaced right inferior pubic ramus fracture. Unchanged mild cortical buckling of the mid iliac wing. Generalized osteopenia. Small extraperitoneal hematoma adjacent to the anterior acetabular fracture, unchanged from the prior exam. There are no new fractures. IMPRESSION: Unchanged alignment of the mildly displaced right anterior acetabular/superior pubic root fracture and mild displaced right inferior pubis ramus fracture. Unchanged minimally displaced bilateral zone 1 sacral ala fractures, and anterior offset at S1-S2 in zone 3 consistent with a transverse component through the sacral body. Unchanged  mild cortical buckling of the mid right iliac wing. Electronically Signed   By: Maurine Simmering M.D.   On: 12/12/2020 14:41   CT PELVIS WO CONTRAST  Result Date: 12/10/2020 CLINICAL DATA:  Pelvic trauma. Recent diagnosis of right pelvic fracture. Complains of increased incontinence. EXAM: CT PELVIS WITHOUT CONTRAST TECHNIQUE: Multidetector CT imaging of the pelvis was performed following the standard protocol without intravenous contrast. COMPARISON:  No prior cross-sectional imaging. AP pelvis and right hip series are available from 12/01/2020. FINDINGS: Urinary Tract: There is slight thickening of the bladder wall versus underdistention, mild perivesical stranding is also seen and may suggest cystitis. The distal ureters are decompressed without stones. Bowel:  No wall thickening or dilatation including the appendix. Vascular/Lymphatic: No enlarged lymph nodes. Moderate aortoiliac calcific plaques without distal AAA. Reproductive: The uterus is intact. There are calcifications in the broad ligaments. The ovaries are not enlarged. Other:  There is no free air, hemorrhage or fluid Musculoskeletal: Generalized osteopenia. There are longitudinal intra-articular fractures of the anterior aspect of both sides of the sacrum extending over several slices, both with slight cortical offset and may have been present on 12/01/2020 but would have been difficult to see. Also noted is a slight cortical fracture buckling in the central right iliac wing, a minimally displaced intra-articular fracture through the anterior column of the right acetabulum, and an oblique fracture through the mid right inferior pubic ramus with the anterior fragment offset by 1/2 of the bone width. The remainder of the sacrum, remainder of the bony pelvis, and visualized proximal femurs bilaterally intact. There is mild narrowing and spurring of the SI joints, pubic symphysis and hip joints, mild enthesopathic change of the pelvis and femoral  trochanters. There are stranding changes in the right pelvic side wall anteriorly and inferiorly along side the fractures but no space-occupying pelvic hematoma. IMPRESSION: 1. Again noted recent fractures of the anterior column right acetabulum with slight displacement, and of the inferior right pubic ramus. There is only a small right hip joint effusion. 2. Periarticular longitudinal fractures of the anterior aspects of the sacrum bilaterally along the SI joints, only slightly displaced and could have been present on the recent plain films and not able to be seen. 3. Cortical buckling fracture of the central right iliac wing. 4. Osteopenia and degenerative change without further visible fracture. 5. Aortoiliac  atherosclerosis. 6. Possible cystitis. 7. Small amount of stranding along side the right-sided fractures in the pelvic wall but no space-occupying pelvic hematoma. Electronically Signed   By: Telford Nab M.D.   On: 12/10/2020 05:10   MR LUMBAR SPINE WO CONTRAST  Result Date: 12/12/2020 CLINICAL DATA:  Fall 1 week ago.  Left buttock and leg pain EXAM: MRI LUMBAR SPINE WITHOUT CONTRAST TECHNIQUE: Multiplanar, multisequence MR imaging of the lumbar spine was performed. No intravenous contrast was administered. COMPARISON:  CT pelvis 12/12/2020 FINDINGS: Segmentation:  Standard. Alignment:  Grade 1 anterolisthesis at L4-5 Vertebrae: There is hyperintense T2-weighted signal within the bone marrow at the S1-2 level, and at both sacral ala. This corresponds to known fractures demonstrated on earlier CT of the pelvis. Conus medullaris and cauda equina: Conus extends to the L2 level. Conus and cauda equina appear normal. Paraspinal and other soft tissues: 9 mm common bile duct Disc levels: L1-L2: Normal disc space and facet joints. No spinal canal stenosis. No neural foraminal stenosis. L2-L3: Normal disc space and facet joints. No spinal canal stenosis. No neural foraminal stenosis. L3-L4: Normal disc space  and facet joints. No spinal canal stenosis. No neural foraminal stenosis. L4-L5: Medium-sized disc bulge, right asymmetric. Moderate facet hypertrophy with bilateral lateral projecting synovial cysts. Both spinal canal stenosis. Moderate right and mild left neural foraminal stenosis. L5-S1: Normal disc space and facet joints. No spinal canal stenosis. No neural foraminal stenosis. Visualized sacrum: Normal. IMPRESSION: 1. Bone marrow edema at the S1-2 level, and at both sacral ala, consistent with known fractures demonstrated on earlier CT of the pelvis. 2. Grade 1 anterolisthesis at L4-L5 secondary to moderate facet arthrosis. There is moderate right and mild left neural foraminal stenosis at this level. Electronically Signed   By: Ulyses Jarred M.D.   On: 12/12/2020 20:31   DG Abd 2 Views  Result Date: 12/12/2020 CLINICAL DATA:  Constipation. Fall 1 week ago with known pelvic fractures. EXAM: ABDOMEN - 2 VIEW COMPARISON:  12/12/2020. FINDINGS: The bowel gas pattern is normal. A mild amount of retained stool is present in the colon. There is no evidence of free air. No radio-opaque calculi. Vascular calcifications are noted in the abdomen. There is mild atelectasis at the left lung base. There is bony defect of the acetabulum and inferior pubic ramus on the right, compatible with known fractures. Sacral alar fractures are also noted on the right and not well seen on the left. IMPRESSION: No bowel obstruction or free air. Mild amount of retained stool in the colon. Electronically Signed   By: Brett Fairy M.D.   On: 12/12/2020 22:38   DG Hip Unilat W or Wo Pelvis 2-3 Views Right  Result Date: 12/01/2020 CLINICAL DATA:  Fall and right hip pain. EXAM: DG HIP (WITH OR WITHOUT PELVIS) 2-3V RIGHT COMPARISON:  None FINDINGS: Nondisplaced fracture of the right superior pubic ramus. No other acute fracture identified. There is no dislocation. The bones are osteopenic. The soft tissues are unremarkable.  Degenerative changes of the lower lumbar spine. IMPRESSION: Nondisplaced fracture of the right superior pubic ramus. Electronically Signed   By: Anner Crete M.D.   On: 12/01/2020 21:18    Labs:  CBC: Recent Labs    12/01/20 2145 12/03/20 0141 12/12/20 1646 12/13/20 0306  WBC 11.4* 8.9 9.5 6.9  HGB 15.7* 14.4 15.5* 14.5  HCT 45.9 42.5 45.5 43.8  PLT 249 195 474* 428*    COAGS: No results for input(s): INR, APTT in the last  8760 hours.  BMP: Recent Labs    12/01/20 2145 12/12/20 1646 12/13/20 0306  NA 138 139 137  K 3.5 3.3* 3.4*  CL 105 104 102  CO2 23 22 25   GLUCOSE 121* 104* 108*  BUN 11 8 9   CALCIUM 9.2 9.3 8.9  CREATININE 0.93 0.84 0.94  GFRNONAA >60 >60 >60    LIVER FUNCTION TESTS: Recent Labs    12/01/20 2145 12/12/20 1646 12/13/20 0306  BILITOT 1.1 0.9 0.5  AST 25 29 23   ALT 20 23 21   ALKPHOS 67 86 83  PROT 6.8 6.7 6.2*  ALBUMIN 4.2 3.7 3.2*    TUMOR MARKERS: No results for input(s): AFPTM, CEA, CA199, CHROMGRNA in the last 8760 hours.  Assessment and Plan:  Severe pain and severely diminished ROM due to pain secondary to Bone marrow edema at the S1-2 level, and at both sacral ala, consistent with known fractures demonstrated on earlier CT of the pelvis.  Patient is unable to move or stand and unable to do any ADLs.  Images reviewed by Dr. Karenann Cai  Dr. Karenann Cai feels patient is a good candidate for sacroplasty and this would give her relief and allow her to get up and move and work with PT so she can regain her independence.  Risks and benefits of sacroplasty were discussed with the patient including, but not limited to education regarding the natural healing process of compression fractures without intervention, bleeding, infection, cement migration which may cause spinal cord damage, paralysis, pulmonary embolism or even death.  This interventional procedure involves the use of X-rays and because of the nature of  the planned procedure, it is possible that we will have prolonged use of X-ray fluoroscopy.  Potential radiation risks to you include (but are not limited to) the following: - A slightly elevated risk for cancer  several years later in life. This risk is typically less than 0.5% percent. This risk is low in comparison to the normal incidence of human cancer, which is 33% for women and 50% for men according to the [employer]. - Radiation induced injury can include skin redness, resembling a rash, tissue breakdown / ulcers and hair loss (which can be temporary or permanent).   The likelihood of either of these occurring depends on the difficulty of the procedure and whether you are sensitive to radiation due to previous procedures, disease, or genetic conditions.   IF your procedure requires a prolonged use of radiation, you will be notified and given written instructions for further action.  It is your responsibility to monitor the irradiated area for the 2 weeks following the procedure and to notify your physician if you are concerned that you have suffered a radiation induced injury.    All of the patient's questions were answered, patient is agreeable to proceed.  Will attempt to get insurance authorization then plan for sacroplasty once that is done.  She will need to be NPO after MN and anticoagulation  held prior to procedure.   Thank you for allowing our service to participate in Riva Sesma 's care.  Electronically Signed: Murrell Redden, PA-C   12/13/2020, 12:56 PM      I spent a total of 40 Minutes  in face to face in clinical consultation, greater than 50% of which was counseling/coordinating care for consideration for sacroplasty.

## 2020-12-13 NOTE — Assessment & Plan Note (Signed)
   Urinary incontinence is a well documented complication of pelvic fractures and hopefully is self limiting.    Using a purewick in the meantime  Bladder scan has identified no evidence of urinary retention

## 2020-12-13 NOTE — Assessment & Plan Note (Signed)
.   Continuing home regimen of lipid lowering therapy.  

## 2020-12-13 NOTE — Discharge Summary (Signed)
Physician Discharge Summary  Carmen Rose ZOX:[id number] DOB: [birth year] DOA: 12/01/2020  PCP: Carmen Rose., MD  Admit date: 12/01/2020 Discharge date: 12/03/2020  Time spent: 35 minutes  Recommendations for Outpatient Follow-up:  Orthopedics Dr. Stann Mainland in 2 weeks PCP in 1 week   Discharge Diagnoses:  Principal Problem:   Closed fracture of superior pubic ramus, right, initial encounter [facility]) Mechanical fall Dyslipidemia  Discharge Condition: Stable  Diet recommendation: Regular  Filed Weights   12/01/20 1747 12/03/20 0423  Weight: 58.5 kg 53 kg    History of present illness:  80/F with history of dyslipidemia and mitral valve prolapse was brought to the ED following a mechanical fall, followed by pain in her right buttock and inability to stand/bear weight, work-up in the ED noted nondisplaced right pubic ramus fracture    Hospital Course:     Pelvic fracture -Following mechanical fall -X-ray noted right pubic ramus fracture, orthopedics consulted, recommended conservative management and WBAT -Added naproxen for 2 to 3 days, also recommended Tylenol use as needed -Lives alone, previously independent in ADLs -PT OT eval completed, discharged home with home health PT   Dyslipidemia -Continue weekly statin  Discharge Exam: Vitals:   12/03/20 0423 12/03/20 0841  BP: 133/63 128/62  Pulse: 79 83  Resp: 17 16  Temp: 98.2 F (36.8 C) 98 F (36.7 C)  SpO2: 93% 93%      Discharge Instructions   Discharge Instructions     Diet general   Complete by: As directed    Increase activity slowly   Complete by: As directed       Allergies as of 12/03/2020       Reactions   Alendronate    Flush face    Lipitor [atorvastatin]    Extreme exhaustion   Milk-related Compounds Diarrhea   Whole milk   Other Other (See Comments)   "pain killers" (constipation) Vitamin C(constipation) Anti inflammatory eye drops(swollen throat and vocal cords    Sudafed [pseudoephedrine Hcl] Other (See Comments)   Vertigo and sleep problems   Adhesive [tape] Rash   Caladryl [pramoxine-calamine] Rash   Contact dermatitis        Medication List     TAKE these medications    acetaminophen 500 MG tablet Commonly known as: TYLENOL Take 1 tablet (500 mg total) by mouth every 6 (six) hours as needed for moderate pain or headache. What changed: how much to take   Biotin 1000 MCG tablet Take 1,000 mcg by mouth at bedtime.   Calcium Carb-Cholecalciferol 600-800 MG-UNIT Tabs Take 1 capsule by mouth 2 (two) times daily before a meal.   MULTIVITAMIN PO Take 1 capsule by mouth daily.   rosuvastatin 10 MG tablet Commonly known as: CRESTOR Take 10 mg by mouth every Sunday.   senna-docusate 8.6-50 MG tablet Commonly known as: Senokot-S Take 1 tablet by mouth at bedtime as needed for mild constipation.   VITAMIN D-3 PO Take 1,000 Units by mouth at bedtime.       ASK your doctor about these medications    naproxen 375 MG tablet Commonly known as: NAPROSYN Take 1 tablet (375 mg total) by mouth 2 (two) times daily with a meal for 3 days. Ask about: Should I take this medication?       Allergies  Allergen Reactions   Alendronate     Flush face    Lipitor [Atorvastatin]     Extreme exhaustion   Milk-Related Compounds Diarrhea    Whole milk  Other Other (See Comments)    "pain killers" (constipation) Vitamin C(constipation) Anti inflammatory eye drops(swollen throat and vocal cords   Sudafed [Pseudoephedrine Hcl] Other (See Comments)    Vertigo and sleep problems   Adhesive [Tape] Rash   Caladryl [Pramoxine-Calamine] Rash    Contact dermatitis     Follow-up Information     Nicholes Stairs, MD. Schedule an appointment as soon as possible for a visit in 2 week(s).   Specialty: Orthopedic Surgery Contact information: [address] [place] [address] [phone]         [facility], [facility] Follow up.   Specialty: [place] Why: the office will call to schedule home health visits Contact information: [place] [address] [phone]                  The results of significant diagnostics from this hospitalization (including imaging, microbiology, ancillary and laboratory) are listed below for reference.    Significant Diagnostic Studies: CT PELVIS WO CONTRAST  Result Date: 12/12/2020 CLINICAL DATA:  Pelvic trauma, fall EXAM: CT PELVIS WITHOUT CONTRAST TECHNIQUE: Multidetector CT imaging of the pelvis was performed following the standard protocol without intravenous contrast. COMPARISON:  CT pelvis 12/10/2020 FINDINGS: Urinary Tract:  No abnormality visualized. Bowel:  Unremarkable visualized pelvic bowel loops. Vascular/Lymphatic: Aortoiliac atherosclerotic calcifications. No aneurysm. No lymphadenopathy. Reproductive:  Unremarkable. Other:  None Musculoskeletal: There are minimally displaced bilateral sacral ala fractures, unchanged from prior exam. There is notable anterior offset at S1-S2 in zone 3, suggesting there is a transverse component through the sacral body (axial image 17, sagittal image 96). No definite zone 2/neural foraminal involvement. Unchanged alignment of the mildly displaced anterior acetabular/superior pubic root fracture and mildly displaced right inferior pubic ramus fracture. Unchanged mild cortical buckling of the mid iliac wing. Generalized osteopenia. Small extraperitoneal hematoma adjacent to the anterior acetabular fracture, unchanged from the prior exam. There are no new fractures. IMPRESSION: Unchanged alignment of the mildly displaced right anterior acetabular/superior pubic root fracture and mild displaced right inferior pubis ramus fracture. Unchanged minimally displaced bilateral zone 1 sacral ala fractures, and anterior offset at S1-S2 in zone 3 consistent with a transverse component through the  sacral body. Unchanged mild cortical buckling of the mid right iliac wing. Electronically Signed   By: Maurine Simmering M.D.   On: 12/12/2020 14:41   CT PELVIS WO CONTRAST  Result Date: 12/10/2020 CLINICAL DATA:  Pelvic trauma. Recent diagnosis of right pelvic fracture. Complains of increased incontinence. EXAM: CT PELVIS WITHOUT CONTRAST TECHNIQUE: Multidetector CT imaging of the pelvis was performed following the standard protocol without intravenous contrast. COMPARISON:  No prior cross-sectional imaging. AP pelvis and right hip series are available from 12/01/2020. FINDINGS: Urinary Tract: There is slight thickening of the bladder wall versus underdistention, mild perivesical stranding is also seen and may suggest cystitis. The distal ureters are decompressed without stones. Bowel:  No wall thickening or dilatation including the appendix. Vascular/Lymphatic: No enlarged lymph nodes. Moderate aortoiliac calcific plaques without distal AAA. Reproductive: The uterus is intact. There are calcifications in the broad ligaments. The ovaries are not enlarged. Other:  There is no free air, hemorrhage or fluid Musculoskeletal: Generalized osteopenia. There are longitudinal intra-articular fractures of the anterior aspect of both sides of the sacrum extending over several slices, both with slight cortical offset and may have been present on 12/01/2020 but would have been difficult to see. Also noted is a slight cortical fracture  buckling in the central right iliac wing, a minimally displaced intra-articular fracture through the anterior column of the right acetabulum, and an oblique fracture through the mid right inferior pubic ramus with the anterior fragment offset by 1/2 of the bone width. The remainder of the sacrum, remainder of the bony pelvis, and visualized proximal femurs bilaterally intact. There is mild narrowing and spurring of the SI joints, pubic symphysis and hip joints, mild enthesopathic change of the  pelvis and femoral trochanters. There are stranding changes in the right pelvic side wall anteriorly and inferiorly along side the fractures but no space-occupying pelvic hematoma. IMPRESSION: 1. Again noted recent fractures of the anterior column right acetabulum with slight displacement, and of the inferior right pubic ramus. There is only a small right hip joint effusion. 2. Periarticular longitudinal fractures of the anterior aspects of the sacrum bilaterally along the SI joints, only slightly displaced and could have been present on the recent plain films and not able to be seen. 3. Cortical buckling fracture of the central right iliac wing. 4. Osteopenia and degenerative change without further visible fracture. 5. Aortoiliac atherosclerosis. 6. Possible cystitis. 7. Small amount of stranding along side the right-sided fractures in the pelvic wall but no space-occupying pelvic hematoma. Electronically Signed   By: Telford Nab M.D.   On: 12/10/2020 05:10   MR LUMBAR SPINE WO CONTRAST  Result Date: 12/12/2020 CLINICAL DATA:  Fall 1 week ago.  Left buttock and leg pain EXAM: MRI LUMBAR SPINE WITHOUT CONTRAST TECHNIQUE: Multiplanar, multisequence MR imaging of the lumbar spine was performed. No intravenous contrast was administered. COMPARISON:  CT pelvis 12/12/2020 FINDINGS: Segmentation:  Standard. Alignment:  Grade 1 anterolisthesis at L4-5 Vertebrae: There is hyperintense T2-weighted signal within the bone marrow at the S1-2 level, and at both sacral ala. This corresponds to known fractures demonstrated on earlier CT of the pelvis. Conus medullaris and cauda equina: Conus extends to the L2 level. Conus and cauda equina appear normal. Paraspinal and other soft tissues: 9 mm common bile duct Disc levels: L1-L2: Normal disc space and facet joints. No spinal canal stenosis. No neural foraminal stenosis. L2-L3: Normal disc space and facet joints. No spinal canal stenosis. No neural foraminal stenosis. L3-L4:  Normal disc space and facet joints. No spinal canal stenosis. No neural foraminal stenosis. L4-L5: Medium-sized disc bulge, right asymmetric. Moderate facet hypertrophy with bilateral lateral projecting synovial cysts. Both spinal canal stenosis. Moderate right and mild left neural foraminal stenosis. L5-S1: Normal disc space and facet joints. No spinal canal stenosis. No neural foraminal stenosis. Visualized sacrum: Normal. IMPRESSION: 1. Bone marrow edema at the S1-2 level, and at both sacral ala, consistent with known fractures demonstrated on earlier CT of the pelvis. 2. Grade 1 anterolisthesis at L4-L5 secondary to moderate facet arthrosis. There is moderate right and mild left neural foraminal stenosis at this level. Electronically Signed   By: Ulyses Jarred M.D.   On: 12/12/2020 20:31   DG Abd 2 Views  Result Date: 12/12/2020 CLINICAL DATA:  Constipation. Fall 1 week ago with known pelvic fractures. EXAM: ABDOMEN - 2 VIEW COMPARISON:  12/12/2020. FINDINGS: The bowel gas pattern is normal. A mild amount of retained stool is present in the colon. There is no evidence of free air. No radio-opaque calculi. Vascular calcifications are noted in the abdomen. There is mild atelectasis at the left lung base. There is bony defect of the acetabulum and inferior pubic ramus on the right, compatible with known fractures. Sacral alar fractures  are also noted on the right and not well seen on the left. IMPRESSION: No bowel obstruction or free air. Mild amount of retained stool in the colon. Electronically Signed   By: Brett Fairy M.D.   On: 12/12/2020 22:38   DG Hip Unilat W or Wo Pelvis 2-3 Views Right  Result Date: 12/01/2020 CLINICAL DATA:  Fall and right hip pain. EXAM: DG HIP (WITH OR WITHOUT PELVIS) 2-3V RIGHT COMPARISON:  None FINDINGS: Nondisplaced fracture of the right superior pubic ramus. No other acute fracture identified. There is no dislocation. The bones are osteopenic. The soft tissues are  unremarkable. Degenerative changes of the lower lumbar spine. IMPRESSION: Nondisplaced fracture of the right superior pubic ramus. Electronically Signed   By: Anner Crete M.D.   On: 12/01/2020 21:18    Microbiology: Recent Results (from the past 240 hour(s))  Resp Panel by RT-PCR (Flu A&B, Covid) Nasopharyngeal Swab     Status: None   Collection Time: 12/13/20  3:55 AM   Specimen: Nasopharyngeal Swab; Nasopharyngeal(NP) swabs in vial transport medium  Result Value Ref Range Status   SARS Coronavirus 2 by RT PCR NEGATIVE NEGATIVE Final    Comment: (NOTE) SARS-CoV-2 target nucleic acids are NOT DETECTED.  The SARS-CoV-2 RNA is generally detectable in upper respiratory specimens during the acute phase of infection. The lowest concentration of SARS-CoV-2 viral copies this assay can detect is 138 copies/mL. A negative result does not preclude SARS-Cov-2 infection and should not be used as the sole basis for treatment or other patient management decisions. A negative result may occur with  improper specimen collection/handling, submission of specimen other than nasopharyngeal swab, presence of viral mutation(s) within the areas targeted by this assay, and inadequate number of viral copies(<138 copies/mL). A negative result must be combined with clinical observations, patient history, and epidemiological information. The expected result is Negative.  Fact Sheet for Patients:  EntrepreneurPulse.com.au  Fact Sheet for Healthcare Providers:  IncredibleEmployment.be  This test is no t yet approved or cleared by the [place] FDA and  has been authorized for detection and/or diagnosis of SARS-CoV-2 by FDA under an Emergency Use Authorization (EUA). This EUA will remain  in effect (meaning this test can be used) for the duration of the COVID-19 declaration under Section 564(b)(1) of the Act, 21 U.S.C.section 360bbb-3(b)(1), unless the authorization  is terminated  or revoked sooner.       Influenza A by PCR NEGATIVE NEGATIVE Final   Influenza B by PCR NEGATIVE NEGATIVE Final    Comment: (NOTE) The Xpert Xpress SARS-CoV-2/FLU/RSV plus assay is intended as an aid in the diagnosis of influenza from Nasopharyngeal swab specimens and should not be used as a sole basis for treatment. Nasal washings and aspirates are unacceptable for Xpert Xpress SARS-CoV-2/FLU/RSV testing.  Fact Sheet for Patients: EntrepreneurPulse.com.au  Fact Sheet for Healthcare Providers: IncredibleEmployment.be  This test is not yet approved or cleared by the [place] FDA and has been authorized for detection and/or diagnosis of SARS-CoV-2 by FDA under an Emergency Use Authorization (EUA). This EUA will remain in effect (meaning this test can be used) for the duration of the COVID-19 declaration under Section 564(b)(1) of the Act, 21 U.S.C. section 360bbb-3(b)(1), unless the authorization is terminated or revoked.  Performed at [facility], [place] [address], [place], [place] [address]      Labs: Basic Metabolic Panel: Recent Labs  Lab 12/12/20 1646 12/13/20 0306  NA 139 137  K 3.3* 3.4*  CL 104  102  CO2 22 25  GLUCOSE 104* 108*  BUN 8 9  CREATININE 0.84 0.94  CALCIUM 9.3 8.9  MG  --  2.3   Liver Function Tests: Recent Labs  Lab 12/12/20 1646 12/13/20 0306  AST 29 23  ALT 23 21  ALKPHOS 86 83  BILITOT 0.9 0.5  PROT 6.7 6.2*  ALBUMIN 3.7 3.2*   No results for input(s): LIPASE, AMYLASE in the last 168 hours. No results for input(s): AMMONIA in the last 168 hours. CBC: Recent Labs  Lab 12/12/20 1646 12/13/20 0306  WBC 9.5 6.9  NEUTROABS 6.8 4.4  HGB 15.5* 14.5  HCT 45.5 43.8  MCV 92.7 94.8  PLT 474* 428*   Cardiac Enzymes: No results for input(s): CKTOTAL, CKMB, CKMBINDEX, TROPONINI in the last 168 hours. BNP: BNP (last 3 results) No results for input(s): BNP in the last 8760  hours.  ProBNP (last 3 results) No results for input(s): PROBNP in the last 8760 hours.  CBG: No results for input(s): GLUCAP in the last 168 hours.     Signed:  Domenic Polite MD.  [facility] 12/13/2020, 3:32 PM

## 2020-12-13 NOTE — Progress Notes (Signed)
PROGRESS NOTE  Carmen Rose IFO:[id number] DOB: 10-Dec-1940 DOA: 12/12/2020 PCP: Ginger Organ., MD   LOS: 0 days   Brief Narrative / Interim history: 80 year old female with past medical history of osteoporosis, hyperlipidemia, mitral valve prolapse with recent diagnosis of nondisplaced fracture of right superior pubic ramus 11/25 after a fall who presents to [facility] with complaints of severe pelvic, left buttock and left leg pain as well as and urinary incontinence.  She was admitted 11/25-11/26 after mechanical fall and nondisplaced pelvic pubic ramus fracture.  Ortho saw her at that time and did not recommend any surgical interventions.  Apparently the day prior to admission she was sitting down on her sofa when she felt like she dropped a bit more than she should have.  She experienced worsening pain, unable to walk and came back to the hospital. CT imaging of the pelvis was performed revealing an unchanged minimally displaced bilateral zone 1 sacral alla fractures and unchanged alignment of the mildly displaced right anterior superior pubic foot fracture.  MRI of the lumbar spine was additionally performed that revealed grade 1 anterolisthesis at L4-L5 with moderate foraminal stenosis  Subjective / 24h Interval events: She appears comfortable at rest but is unable to move due to pain.  Assessment & Plan: Principal Problem:   Left buttock pain Active Problems:   Closed fracture of superior pubic ramus, right, initial encounter (HCC)   Anterolisthesis of lumbar spine   Urinary incontinence   Mixed hyperlipidemia   Closed zone 1 fracture of sacrum (HCC)   Closed zone 3 fracture of sacrum (HCC)  Principal Problem Pelvic pain, sacral fracture-due to sacral alla fracture mildly displaced right anterior superior pubic foot fracture.  She seems to be failing conservative management and had to be hospitalized again.  Continue pain medications, requiring IV morphine this  morning.  Per admit her, neurosurgery and IR to weigh in -PT/OT, may need SNF placement  Active Problems Urinary incontinence-continue to monitor  Mixed hyperlipidemia - continue statin  Scheduled Meds:  enoxaparin (LOVENOX) injection  40 mg Subcutaneous QHS    morphine injection  2 mg Intravenous Once   naproxen  500 mg Oral BID WC   pantoprazole  40 mg Oral Daily   [START ON 12/17/2020] rosuvastatin  10 mg Oral Q Sun   Continuous Infusions: PRN Meds:.acetaminophen **OR** acetaminophen, oxyCODONE-acetaminophen **OR** morphine injection, ondansetron **OR** ondansetron (ZOFRAN) IV, polyethylene glycol, tiZANidine  Diet Orders (From admission, onward)     Start     Ordered   12/13/20 0411  Diet NPO time specified  Diet effective now        12/13/20 0411            DVT prophylaxis: enoxaparin (LOVENOX) injection 40 mg Start: 12/12/20 2230     Code Status: Full Code  Family Communication: no family at bedside   Status is: Inpatient  Remains inpatient appropriate because: intractable pain  Level of care: Med-Surg  Consultants:  Neurosurgery  IR  Procedures:  none  Microbiology  none  Antimicrobials: none    Objective: Vitals:   12/13/20 0600 12/13/20 0700 12/13/20 0800 12/13/20 0900  BP: (!) 150/78 (!) 149/136 (!) 141/80 (!) 155/78  Pulse: 83 91 92 81  Resp: 19 16 17 10   Temp:      TempSrc:      SpO2: 98% 99% 100% 100%  Weight:      Height:        Intake/Output Summary (Last 24 hours)  at 12/13/2020 0940 Last data filed at 12/12/2020 2235 Gross per 24 hour  Intake --  Output 149 ml  Net -149 ml   Filed Weights   12/13/20 0425  Weight: 53 kg    Examination:  Constitutional: NAD Eyes: no scleral icterus ENMT: Mucous membranes are moist.  Neck: normal, supple Respiratory: clear to auscultation bilaterally, no wheezing, no crackles. Normal respiratory effort.  Cardiovascular: Regular rate and rhythm, no murmurs / rubs / gallops. No LE  edema.  Abdomen: non distended, no tenderness. Bowel sounds positive.  Musculoskeletal: no clubbing / cyanosis.  Skin: no rashes Neurologic: CN 2-12 grossly intact. Strength 5/5 in all 4.  Psychiatric: Normal judgment and insight. Alert and oriented x 3. Normal mood.    Data Reviewed: I have independently reviewed following labs and imaging studies   CBC: Recent Labs  Lab 12/12/20 1646 12/13/20 0306  WBC 9.5 6.9  NEUTROABS 6.8 4.4  HGB 15.5* 14.5  HCT 45.5 43.8  MCV 92.7 94.8  PLT 474* 443*   Basic Metabolic Panel: Recent Labs  Lab 12/12/20 1646 12/13/20 0306  NA 139 137  K 3.3* 3.4*  CL 104 102  CO2 22 25  GLUCOSE 104* 108*  BUN 8 9  CREATININE 0.84 0.94  CALCIUM 9.3 8.9  MG  --  2.3   Liver Function Tests: Recent Labs  Lab 12/12/20 1646 12/13/20 0306  AST 29 23  ALT 23 21  ALKPHOS 86 83  BILITOT 0.9 0.5  PROT 6.7 6.2*  ALBUMIN 3.7 3.2*   Coagulation Profile: No results for input(s): INR, PROTIME in the last 168 hours. HbA1C: No results for input(s): HGBA1C in the last 72 hours. CBG: No results for input(s): GLUCAP in the last 168 hours.  Recent Results (from the past 240 hour(s))  Resp Panel by RT-PCR (Flu A&B, Covid) Nasopharyngeal Swab     Status: None   Collection Time: 12/13/20  3:55 AM   Specimen: Nasopharyngeal Swab; Nasopharyngeal(NP) swabs in vial transport medium  Result Value Ref Range Status   SARS Coronavirus 2 by RT PCR NEGATIVE NEGATIVE Final    Comment: (NOTE) SARS-CoV-2 target nucleic acids are NOT DETECTED.  The SARS-CoV-2 RNA is generally detectable in upper respiratory specimens during the acute phase of infection. The lowest concentration of SARS-CoV-2 viral copies this assay can detect is 138 copies/mL. A negative result does not preclude SARS-Cov-2 infection and should not be used as the sole basis for treatment or other patient management decisions. A negative result may occur with  improper specimen collection/handling,  submission of specimen other than nasopharyngeal swab, presence of viral mutation(s) within the areas targeted by this assay, and inadequate number of viral copies(<138 copies/mL). A negative result must be combined with clinical observations, patient history, and epidemiological information. The expected result is Negative.  Fact Sheet for Patients:  EntrepreneurPulse.com.au  Fact Sheet for Healthcare Providers:  IncredibleEmployment.be  This test is no t yet approved or cleared by the [place] FDA and  has been authorized for detection and/or diagnosis of SARS-CoV-2 by FDA under an Emergency Use Authorization (EUA). This EUA will remain  in effect (meaning this test can be used) for the duration of the COVID-19 declaration under Section 564(b)(1) of the Act, 21 U.S.C.section 360bbb-3(b)(1), unless the authorization is terminated  or revoked sooner.       Influenza A by PCR NEGATIVE NEGATIVE Final   Influenza B by PCR NEGATIVE NEGATIVE Final    Comment: (NOTE) The Xpert Xpress SARS-CoV-2/FLU/RSV  plus assay is intended as an aid in the diagnosis of influenza from Nasopharyngeal swab specimens and should not be used as a sole basis for treatment. Nasal washings and aspirates are unacceptable for Xpert Xpress SARS-CoV-2/FLU/RSV testing.  Fact Sheet for Patients: EntrepreneurPulse.com.au  Fact Sheet for Healthcare Providers: IncredibleEmployment.be  This test is not yet approved or cleared by the [place] FDA and has been authorized for detection and/or diagnosis of SARS-CoV-2 by FDA under an Emergency Use Authorization (EUA). This EUA will remain in effect (meaning this test can be used) for the duration of the COVID-19 declaration under Section 564(b)(1) of the Act, 21 U.S.C. section 360bbb-3(b)(1), unless the authorization is terminated or revoked.  Performed at [facility], [place] [address], [place], [place] [address]      Radiology Studies: CT PELVIS WO CONTRAST  Result Date: 12/12/2020 CLINICAL DATA:  Pelvic trauma, fall EXAM: CT PELVIS WITHOUT CONTRAST TECHNIQUE: Multidetector CT imaging of the pelvis was performed following the standard protocol without intravenous contrast. COMPARISON:  CT pelvis 12/10/2020 FINDINGS: Urinary Tract:  No abnormality visualized. Bowel:  Unremarkable visualized pelvic bowel loops. Vascular/Lymphatic: Aortoiliac atherosclerotic calcifications. No aneurysm. No lymphadenopathy. Reproductive:  Unremarkable. Other:  None Musculoskeletal: There are minimally displaced bilateral sacral ala fractures, unchanged from prior exam. There is notable anterior offset at S1-S2 in zone 3, suggesting there is a transverse component through the sacral body (axial image 17, sagittal image 96). No definite zone 2/neural foraminal involvement. Unchanged alignment of the mildly displaced anterior acetabular/superior pubic root fracture and mildly displaced right inferior pubic ramus fracture. Unchanged mild cortical buckling of the mid iliac wing. Generalized osteopenia. Small extraperitoneal hematoma adjacent to the anterior acetabular fracture, unchanged from the prior exam. There are no new fractures. IMPRESSION: Unchanged alignment of the mildly displaced right anterior acetabular/superior pubic root fracture and mild displaced right inferior pubis ramus fracture. Unchanged minimally displaced bilateral zone 1 sacral ala fractures, and anterior offset at S1-S2 in zone 3 consistent with a transverse component through the sacral body. Unchanged mild cortical buckling of the mid right iliac wing. Electronically Signed   By: Maurine Simmering M.D.   On: 12/12/2020 14:41   MR LUMBAR SPINE WO CONTRAST  Result Date: 12/12/2020 CLINICAL DATA:  Fall 1 week ago.  Left buttock and leg pain EXAM: MRI LUMBAR SPINE WITHOUT CONTRAST TECHNIQUE: Multiplanar, multisequence MR imaging of the  lumbar spine was performed. No intravenous contrast was administered. COMPARISON:  CT pelvis 12/12/2020 FINDINGS: Segmentation:  Standard. Alignment:  Grade 1 anterolisthesis at L4-5 Vertebrae: There is hyperintense T2-weighted signal within the bone marrow at the S1-2 level, and at both sacral ala. This corresponds to known fractures demonstrated on earlier CT of the pelvis. Conus medullaris and cauda equina: Conus extends to the L2 level. Conus and cauda equina appear normal. Paraspinal and other soft tissues: 9 mm common bile duct Disc levels: L1-L2: Normal disc space and facet joints. No spinal canal stenosis. No neural foraminal stenosis. L2-L3: Normal disc space and facet joints. No spinal canal stenosis. No neural foraminal stenosis. L3-L4: Normal disc space and facet joints. No spinal canal stenosis. No neural foraminal stenosis. L4-L5: Medium-sized disc bulge, right asymmetric. Moderate facet hypertrophy with bilateral lateral projecting synovial cysts. Both spinal canal stenosis. Moderate right and mild left neural foraminal stenosis. L5-S1: Normal disc space and facet joints. No spinal canal stenosis. No neural foraminal stenosis. Visualized sacrum: Normal. IMPRESSION: 1. Bone marrow edema at the S1-2 level, and at both sacral  ala, consistent with known fractures demonstrated on earlier CT of the pelvis. 2. Grade 1 anterolisthesis at L4-L5 secondary to moderate facet arthrosis. There is moderate right and mild left neural foraminal stenosis at this level. Electronically Signed   By: Ulyses Jarred M.D.   On: 12/12/2020 20:31   DG Abd 2 Views  Result Date: 12/12/2020 CLINICAL DATA:  Constipation. Fall 1 week ago with known pelvic fractures. EXAM: ABDOMEN - 2 VIEW COMPARISON:  12/12/2020. FINDINGS: The bowel gas pattern is normal. A mild amount of retained stool is present in the colon. There is no evidence of free air. No radio-opaque calculi. Vascular calcifications are noted in the abdomen. There is  mild atelectasis at the left lung base. There is bony defect of the acetabulum and inferior pubic ramus on the right, compatible with known fractures. Sacral alar fractures are also noted on the right and not well seen on the left. IMPRESSION: No bowel obstruction or free air. Mild amount of retained stool in the colon. Electronically Signed   By: Brett Fairy M.D.   On: 12/12/2020 22:38     Marzetta Board, MD, PhD [facility]  Between 7 am - 7 pm I am available, please contact me via Amion (for emergencies) or Securechat (non urgent messages)  Between 7 pm - 7 am I am not available, please contact night coverage MD/APP via Amion

## 2020-12-13 NOTE — ED Notes (Signed)
Pt continues to c/o severe pain. Adjusted in bed, provided ice pack, and she has had all pain meds. Provider sent a message reference same

## 2020-12-14 LAB — COMPREHENSIVE METABOLIC PANEL
ALT: 19 U/L (ref 0–44)
AST: 24 U/L (ref 15–41)
Albumin: 3.3 g/dL — ABNORMAL LOW (ref 3.5–5.0)
Alkaline Phosphatase: 83 U/L (ref 38–126)
Anion gap: 11 (ref 5–15)
BUN: 8 mg/dL (ref 8–23)
CO2: 21 mmol/L — ABNORMAL LOW (ref 22–32)
Calcium: 9 mg/dL (ref 8.9–10.3)
Chloride: 105 mmol/L (ref 98–111)
Creatinine, Ser: 0.92 mg/dL (ref 0.44–1.00)
GFR, Estimated: >60 mL/min (ref >60)
Glucose, Bld: 108 mg/dL — ABNORMAL HIGH (ref 70–99)
Potassium: 4.4 mmol/L (ref 3.5–5.1)
Sodium: 137 mmol/L (ref 135–145)
Total Bilirubin: 0.7 mg/dL (ref 0.3–1.2)
Total Protein: 6.1 g/dL — ABNORMAL LOW (ref 6.5–8.1)

## 2020-12-14 LAB — CBC
HCT: 43.9 % (ref 36.0–46.0)
Hemoglobin: 15.2 g/dL — ABNORMAL HIGH (ref 12.0–15.0)
MCH: 31.6 pg (ref 26.0–34.0)
MCHC: 34.6 g/dL (ref 30.0–36.0)
MCV: 91.3 fL (ref 80.0–100.0)
Platelets: 412 10*3/uL — ABNORMAL HIGH (ref 150–400)
RBC: 4.81 MIL/uL (ref 3.87–5.11)
RDW: 13 % (ref 11.5–15.5)
WBC: 11 10*3/uL — ABNORMAL HIGH (ref 4.0–10.5)
nRBC: 0 % (ref 0.0–0.2)

## 2020-12-14 LAB — VITAMIN D 25 HYDROXY (VIT D DEFICIENCY, FRACTURES): Vit D, 25-Hydroxy: 53.28 ng/mL (ref 30–100)

## 2020-12-14 MED ORDER — CEPHALEXIN 500 MG PO CAPS
500.0000 mg | ORAL_CAPSULE | Freq: Two times a day (BID) | ORAL | Status: AC
  Administered 2020-12-14 – 2020-12-16 (×6): 500 mg via ORAL
  Filled 2020-12-14 (×6): qty 1

## 2020-12-14 NOTE — Progress Notes (Signed)
Mobility Specialist Progress Note   12/14/20 1249  Mobility  Activity Sat and stood x 3;Dangled on edge of bed  Level of Assistance Minimal assist, patient does 75% or more  Assistive Device Front wheel walker  RLE Weight Bearing WBAT  Mobility Response Tolerated well  Mobility performed by Mobility specialist  $Mobility charge 1 Mobility   Pt having slight discomfort in L hip but agreeable to mobility. Mod I to EOB and able to perform LE exercises w/o inc pain. X3 STS to assist in pericare after pt accidentally voided themselves. Initial stand took min A but remaining stands were contact guard. Returned back to bed w/ call bell by side and needs met.     Holland Falling Mobility Specialist Phone Number [phone]

## 2020-12-14 NOTE — Progress Notes (Signed)
PROGRESS NOTE  Carmen Rose ZOX:[id number] DOB: 06-23-1940 DOA: 12/12/2020 PCP: Ginger Organ., MD   LOS: 1 day   Brief Narrative / Interim history: 80 year old female with past medical history of osteoporosis, hyperlipidemia, mitral valve prolapse with recent diagnosis of nondisplaced fracture of right superior pubic ramus 11/25 after a fall who presents to [facility] with complaints of severe pelvic, left buttock and left leg pain as well as and urinary incontinence.  She was admitted 11/25-11/26 after mechanical fall and nondisplaced pelvic pubic ramus fracture.  Ortho saw her at that time and did not recommend any surgical interventions.  Apparently the day prior to admission she was sitting down on her sofa when she felt like she dropped a bit more than she should have.  She experienced worsening pain, unable to walk and came back to the hospital. CT imaging of the pelvis was performed revealing an unchanged minimally displaced bilateral zone 1 sacral alla fractures and unchanged alignment of the mildly displaced right anterior superior pubic foot fracture.  MRI of the lumbar spine was additionally performed that revealed grade 1 anterolisthesis at L4-L5 with moderate foraminal stenosis  Subjective / 24h Interval events: She appears more comfortable this morning.  States that pain is better controlled.  She discussed with IR yesterday and awaiting decision about a sacroplasty, pending insurance authorization  Assessment & Plan: Principal Problem:   Left buttock pain Active Problems:   Closed fracture of superior pubic ramus, right, initial encounter (HCC)   Anterolisthesis of lumbar spine   Urinary incontinence   Mixed hyperlipidemia   Closed zone 1 fracture of sacrum (HCC)   Closed zone 3 fracture of sacrum (HCC)  Principal Problem Pelvic pain, sacral fracture-due to sacral alla fracture mildly displaced right anterior superior pubic foot fracture.  She seems to be  failing conservative management and had to be hospitalized again.  Continue pain medications, requiring IV morphine this morning.  Neurosurgery evaluated patient without interventions needed -IR considering a sacroplasty pending insurance authorization, this is pending -PT/OT consulted, she will need SNF  Active Problems Urinary incontinence due to UTI-she has a degree of urinary incontinence but feels worse now.  Also has some lower abdominal discomfort.  WBC increased a little bit today, and urine culture showing gram-negative rods.  Placed empirically on Keflex for 3 days and follow cultures  Mixed hyperlipidemia - continue statin  Scheduled Meds:  cephALEXin  500 mg Oral Q12H   enoxaparin (LOVENOX) injection  40 mg Subcutaneous QHS    morphine injection  2 mg Intravenous Once   naproxen  500 mg Oral BID WC   pantoprazole  40 mg Oral Daily   [START ON 12/17/2020] rosuvastatin  10 mg Oral Q Sun   Continuous Infusions: PRN Meds:.acetaminophen **OR** acetaminophen, oxyCODONE-acetaminophen **OR** morphine injection, ondansetron **OR** ondansetron (ZOFRAN) IV, polyethylene glycol, tiZANidine  Diet Orders (From admission, onward)     Start     Ordered   12/13/20 0951  Diet clear liquid Room service appropriate? Yes; Fluid consistency: Thin  Diet effective now       Question Answer Comment  Room service appropriate? Yes   Fluid consistency: Thin      12/13/20 0950            DVT prophylaxis: enoxaparin (LOVENOX) injection 40 mg Start: 12/12/20 2230     Code Status: Full Code  Family Communication: no family at bedside   Status is: Inpatient  Remains inpatient appropriate because: intractable pain  Level of care: Med-Surg  Consultants:  Neurosurgery  IR  Procedures:  none  Microbiology  none  Antimicrobials: none    Objective: Vitals:   12/13/20 1816 12/13/20 2110 12/13/20 2110 12/14/20 0430  BP: (!) 168/93 (!) 156/82 (!) 156/82 (!) 159/99  Pulse: 95 82  86 (!) 104  Resp: 16   20  Temp: 98.3 F (36.8 C) 97.9 F (36.6 C) 97.9 F (36.6 C) 97.9 F (36.6 C)  TempSrc: Oral Oral Oral   SpO2: 100% 94% 95% 97%  Weight:      Height:       No intake or output data in the 24 hours ending 12/14/20 0933  Filed Weights   12/13/20 0425  Weight: 53 kg    Examination:  Constitutional: She is in no distress Eyes: Anicteric ENMT: Moist mucous membranes Neck: normal, supple Respiratory: Lungs are clear bilaterally, no wheezing or crackles heard Cardiovascular: Regular rate and rhythm, no murmurs, no edema Abdomen: Soft, NT, ND, bowel sounds positive Musculoskeletal: no clubbing / cyanosis.  Skin: No rashes appreciated Neurologic: Nonfocal, equal strength   Data Reviewed: I have independently reviewed following labs and imaging studies   CBC: Recent Labs  Lab 12/12/20 1646 12/13/20 0306 12/14/20 0133  WBC 9.5 6.9 11.0*  NEUTROABS 6.8 4.4  --   HGB 15.5* 14.5 15.2*  HCT 45.5 43.8 43.9  MCV 92.7 94.8 91.3  PLT 474* 428* 412*    Basic Metabolic Panel: Recent Labs  Lab 12/12/20 1646 12/13/20 0306 12/14/20 0133  NA 139 137 137  K 3.3* 3.4* 4.4  CL 104 102 105  CO2 22 25 21*  GLUCOSE 104* 108* 108*  BUN 8 9 8   CREATININE 0.84 0.94 0.92  CALCIUM 9.3 8.9 9.0  MG  --  2.3  --     Liver Function Tests: Recent Labs  Lab 12/12/20 1646 12/13/20 0306 12/14/20 0133  AST 29 23 24   ALT 23 21 19   ALKPHOS 86 83 83  BILITOT 0.9 0.5 0.7  PROT 6.7 6.2* 6.1*  ALBUMIN 3.7 3.2* 3.3*    Coagulation Profile: No results for input(s): INR, PROTIME in the last 168 hours. HbA1C: No results for input(s): HGBA1C in the last 72 hours. CBG: No results for input(s): GLUCAP in the last 168 hours.  Recent Results (from the past 240 hour(s))  Resp Panel by RT-PCR (Flu A&B, Covid) Nasopharyngeal Swab     Status: None   Collection Time: 12/13/20  3:55 AM   Specimen: Nasopharyngeal Swab; Nasopharyngeal(NP) swabs in vial transport medium   Result Value Ref Range Status   SARS Coronavirus 2 by RT PCR NEGATIVE NEGATIVE Final    Comment: (NOTE) SARS-CoV-2 target nucleic acids are NOT DETECTED.  The SARS-CoV-2 RNA is generally detectable in upper respiratory specimens during the acute phase of infection. The lowest concentration of SARS-CoV-2 viral copies this assay can detect is 138 copies/mL. A negative result does not preclude SARS-Cov-2 infection and should not be used as the sole basis for treatment or other patient management decisions. A negative result may occur with  improper specimen collection/handling, submission of specimen other than nasopharyngeal swab, presence of viral mutation(s) within the areas targeted by this assay, and inadequate number of viral copies(<138 copies/mL). A negative result must be combined with clinical observations, patient history, and epidemiological information. The expected result is Negative.  Fact Sheet for Patients:  EntrepreneurPulse.com.au  Fact Sheet for Healthcare Providers:  IncredibleEmployment.be  This test is no t yet approved or  cleared by the [place] and  has been authorized for detection and/or diagnosis of SARS-CoV-2 by FDA under an Emergency Use Authorization (EUA). This EUA will remain  in effect (meaning this test can be used) for the duration of the COVID-19 declaration under Section 564(b)(1) of the Act, 21 U.S.C.section 360bbb-3(b)(1), unless the authorization is terminated  or revoked sooner.       Influenza A by PCR NEGATIVE NEGATIVE Final   Influenza B by PCR NEGATIVE NEGATIVE Final    Comment: (NOTE) The Xpert Xpress SARS-CoV-2/FLU/RSV plus assay is intended as an aid in the diagnosis of influenza from Nasopharyngeal swab specimens and should not be used as a sole basis for treatment. Nasal washings and aspirates are unacceptable for Xpert Xpress SARS-CoV-2/FLU/RSV testing.  Fact Sheet for  Patients: EntrepreneurPulse.com.au  Fact Sheet for Healthcare Providers: IncredibleEmployment.be  This test is not yet approved or cleared by the [place] FDA and has been authorized for detection and/or diagnosis of SARS-CoV-2 by FDA under an Emergency Use Authorization (EUA). This EUA will remain in effect (meaning this test can be used) for the duration of the COVID-19 declaration under Section 564(b)(1) of the Act, 21 U.S.C. section 360bbb-3(b)(1), unless the authorization is terminated or revoked.  Performed at [facility], [place] [address], [place], [place] [address]   Urine Culture     Status: Abnormal (Preliminary result)   Collection Time: 12/13/20  3:59 AM   Specimen: Urine, Clean Catch  Result Value Ref Range Status   Specimen Description URINE, CLEAN CATCH  Final   Special Requests NONE  Final   Culture (A)  Final    >=100,000 COLONIES/mL GRAM NEGATIVE RODS CULTURE REINCUBATED FOR BETTER GROWTH SUSCEPTIBILITIES TO FOLLOW Performed at [facility], [address] [place], [place] [address]    Report Status PENDING  Incomplete      Radiology Studies: No results found.   Marzetta Board, MD, PhD [facility]  Between 7 am - 7 pm I am available, please contact me via Amion (for emergencies) or Securechat (non urgent messages)  Between 7 pm - 7 am I am not available, please contact night coverage MD/APP via Amion

## 2020-12-15 LAB — BASIC METABOLIC PANEL
Anion gap: 12 (ref 5–15)
BUN: 10 mg/dL (ref 8–23)
CO2: 24 mmol/L (ref 22–32)
Calcium: 9.3 mg/dL (ref 8.9–10.3)
Chloride: 102 mmol/L (ref 98–111)
Creatinine, Ser: 0.91 mg/dL (ref 0.44–1.00)
GFR, Estimated: >60 mL/min (ref >60)
Glucose, Bld: 107 mg/dL — ABNORMAL HIGH (ref 70–99)
Potassium: 4.3 mmol/L (ref 3.5–5.1)
Sodium: 138 mmol/L (ref 135–145)

## 2020-12-15 LAB — CBC
HCT: 47.1 % — ABNORMAL HIGH (ref 36.0–46.0)
Hemoglobin: 16.4 g/dL — ABNORMAL HIGH (ref 12.0–15.0)
MCH: 31.9 pg (ref 26.0–34.0)
MCHC: 34.8 g/dL (ref 30.0–36.0)
MCV: 91.6 fL (ref 80.0–100.0)
Platelets: 500 10*3/uL — ABNORMAL HIGH (ref 150–400)
RBC: 5.14 MIL/uL — ABNORMAL HIGH (ref 3.87–5.11)
RDW: 13.2 % (ref 11.5–15.5)
WBC: 12.4 10*3/uL — ABNORMAL HIGH (ref 4.0–10.5)
nRBC: 0 % (ref 0.0–0.2)

## 2020-12-15 MED ORDER — WHITE PETROLATUM EX OINT
TOPICAL_OINTMENT | CUTANEOUS | Status: AC
  Filled 2020-12-15: qty 28.35

## 2020-12-15 MED ORDER — OXYCODONE-ACETAMINOPHEN 5-325 MG PO TABS
1.0000 | ORAL_TABLET | ORAL | Status: DC | PRN
  Administered 2020-12-15 – 2020-12-25 (×27): 1 via ORAL
  Filled 2020-12-15 (×27): qty 1

## 2020-12-15 MED ORDER — MORPHINE SULFATE (PF) 4 MG/ML IV SOLN
4.0000 mg | INTRAVENOUS | Status: DC | PRN
  Administered 2020-12-16 – 2020-12-20 (×11): 4 mg via INTRAVENOUS
  Filled 2020-12-15 (×11): qty 1

## 2020-12-15 NOTE — Progress Notes (Signed)
Mobility Specialist Progress Note:   12/15/20 1500  Mobility  Activity Ambulated in room  Level of Assistance Contact guard assist, steadying assist  Assistive Device Front wheel walker  RLE Weight Bearing WBAT  Distance Ambulated (ft) 10 ft  Mobility Ambulated with assistance in room  Mobility Response Tolerated well  Mobility performed by Mobility specialist  $Mobility charge 1 Mobility   Pt with pain in L hip/buttock during session. Otherwise asx. Pt back in bed with bed alarm on.   Nelta Numbers Mobility Specialist  Phone [phone]

## 2020-12-15 NOTE — Progress Notes (Signed)
Physical Therapy Treatment Patient Details Name: Carmen Rose MRN: [id number] DOB: 04-22-1940 Today's Date: 12/15/2020   History of Present Illness 80 y/o female admitted 12/6 secondary to increased pain on the L buttock. Recent non displaced R pelvic fx. Now has sacral ala fracture and noted grade 1 anterolisthesis at L4-L5 with moderate foraminal stenosis.  PMH includes anemia.    PT Comments    Pt is up to walk today with short trips, hindered mostly by weakness and pain.  Her efforts are an improvement but from a safety standpoint cannot go home alone.  Will continue to recommend rehab due to her safely limits, strength of LE's and low endurance to walk and stand. Her fall risk is too high for home, and pt is in agreement with this.  Follow along for PT goals as are outlined below.   Recommendations for follow up therapy are one component of a multi-disciplinary discharge planning process, led by the attending physician.  Recommendations may be updated based on patient status, additional functional criteria and insurance authorization.  Follow Up Recommendations  Skilled nursing-short term rehab (<3 hours/day)     Assistance Recommended at Discharge Frequent or constant Supervision/Assistance  Equipment Recommendations  None recommended by PT    Recommendations for Other Services       Precautions / Restrictions Precautions Precautions: Fall Restrictions Weight Bearing Restrictions: Yes RLE Weight Bearing: Weight bearing as tolerated     Mobility  Bed Mobility Overal bed mobility: Modified Independent                  Transfers Overall transfer level: Needs assistance Equipment used: Rolling walker (2 wheels) Transfers: Sit to/from Stand Sit to Stand: Min guard           General transfer comment: min guard with cues for safety as pt once tried to push out on walker in more flexed posture    Ambulation/Gait Ambulation/Gait assistance: Min  guard Gait Distance (Feet): 12 Feet (4 x 3) Assistive device: Rolling walker (2 wheels) Gait Pattern/deviations: Step-to pattern;Step-through pattern;Decreased stride length Gait velocity: reduced Gait velocity interpretation: <1.31 ft/sec, indicative of household ambulator Pre-gait activities: standing balance and sidestepping General Gait Details: discussed safety with walker so that pt does not stress her spine and push out on RW too far to recover balance if up alone   Stairs             Wheelchair Mobility    Modified Rankin (Stroke Patients Only)       Balance Overall balance assessment: Needs assistance Sitting-balance support: Feet supported Sitting balance-Leahy Scale: Good     Standing balance support: Bilateral upper extremity supported;During functional activity Standing balance-Leahy Scale: Poor                              Cognition Arousal/Alertness: Awake/alert Behavior During Therapy: WFL for tasks assessed/performed Overall Cognitive Status: Within Functional Limits for tasks assessed                                          Exercises      General Comments General comments (skin integrity, edema, etc.): pt was assisted to stand and then wgt shift, walked on RW three short trips with help to recover posture next to bed.  Declined OOB to chair on two requests  Pertinent Vitals/Pain Pain Assessment: Faces Faces Pain Scale: Hurts little more Pain Location: B hips Pain Descriptors / Indicators: Guarding Pain Intervention(s): Limited activity within patient's tolerance;Monitored during session;Repositioned;Premedicated before session;Heat applied    Home Living                          Prior Function            PT Goals (current goals can now be found in the care plan section) Acute Rehab PT Goals Patient Stated Goal: to get stronger and go home Progress towards PT goals: Progressing toward  goals    Frequency    Min 2X/week      PT Plan Current plan remains appropriate    Co-evaluation              AM-PAC PT "6 Clicks" Mobility   Outcome Measure  Help needed turning from your back to your side while in a flat bed without using bedrails?: None Help needed moving from lying on your back to sitting on the side of a flat bed without using bedrails?: None Help needed moving to and from a bed to a chair (including a wheelchair)?: A Little Help needed standing up from a chair using your arms (e.g., wheelchair or bedside chair)?: A Little Help needed to walk in hospital room?: A Lot Help needed climbing 3-5 steps with a railing? : Total 6 Click Score: 17    End of Session Equipment Utilized During Treatment: Gait belt Activity Tolerance: Patient limited by pain Patient left: in bed;with call bell/phone within reach Nurse Communication: Mobility status PT Visit Diagnosis: Other abnormalities of gait and mobility (R26.89);History of falling (Z91.81);Pain Pain - Right/Left:  (bilateral) Pain - part of body: Hip     Time: 1204-1230 PT Time Calculation (min) (ACUTE ONLY): 26 min  Charges:  $Gait Training: 8-22 mins $Therapeutic Activity: 8-22 mins       Ramond Dial 12/15/2020, 2:21 PM  Mee Hives, PT PhD Acute Rehab Dept. Number: [place] [facility] [place]

## 2020-12-15 NOTE — Care Management Important Message (Signed)
Important Message  Patient Details  Name: Carmen Rose MRN: [id number] Date of Birth: [birth year]   Medicare Important Message Given:  Yes     [name] 12/15/2020, 12:54 PM

## 2020-12-15 NOTE — Progress Notes (Signed)
PROGRESS NOTE  Carmen Rose VZC:[id number] DOB: January 07, 1941 DOA: 12/12/2020 PCP: Ginger Organ., MD   LOS: 2 days   Brief Narrative / Interim history: 80 year old female with past medical history of osteoporosis, hyperlipidemia, mitral valve prolapse with recent diagnosis of nondisplaced fracture of right superior pubic ramus 11/25 after a fall who presents to [facility] with complaints of severe pelvic, left buttock and left leg pain as well as and urinary incontinence.  She was admitted 11/25-11/26 after mechanical fall and nondisplaced pelvic pubic ramus fracture.  Ortho saw her at that time and did not recommend any surgical interventions.  Apparently the day prior to admission she was sitting down on her sofa when she felt like she dropped a bit more than she should have.  She experienced worsening pain, unable to walk and came back to the hospital. CT imaging of the pelvis was performed revealing an unchanged minimally displaced bilateral zone 1 sacral alla fractures and unchanged alignment of the mildly displaced right anterior superior pubic foot fracture.  MRI of the lumbar spine was additionally performed that revealed grade 1 anterolisthesis at L4-L5 with moderate foraminal stenosis  Subjective / 24h Interval events: Complains of leg pain and pelvic pain.  Assessment & Plan: Principal Problem:   Left buttock pain Active Problems:   Closed fracture of superior pubic ramus, right, initial encounter (HCC)   Anterolisthesis of lumbar spine   Urinary incontinence   Mixed hyperlipidemia   Closed zone 1 fracture of sacrum (HCC)   Closed zone 3 fracture of sacrum (HCC)  Principal Problem Pelvic pain, sacral fracture-due to sacral alla fracture mildly displaced right anterior superior pubic foot fracture.  She seems to be failing conservative management and had to be hospitalized again.  Continue pain medications, requiring IV morphine this morning.  Neurosurgery evaluated  patient without interventions needed -IR considering a sacroplasty pending insurance authorization, still pending this morning -PT/OT consulted, she will need SNF afterwards  Active Problems Urinary incontinence due to UTI-she has a degree of urinary incontinence but feels worse now.  Also has some lower abdominal discomfort.  WBC increased a little bit.  Urine cultures grew Klebsiella sensitive to cefazolin, continue Keflex.  Also shows Enterococcus but only 30,000 CFU's  Mixed hyperlipidemia - continue statin  Scheduled Meds:  cephALEXin  500 mg Oral Q12H   enoxaparin (LOVENOX) injection  40 mg Subcutaneous QHS    morphine injection  2 mg Intravenous Once   naproxen  500 mg Oral BID WC   pantoprazole  40 mg Oral Daily   [START ON 12/17/2020] rosuvastatin  10 mg Oral Q Sun   Continuous Infusions: PRN Meds:.acetaminophen **OR** acetaminophen, oxyCODONE-acetaminophen **OR** morphine injection, ondansetron **OR** ondansetron (ZOFRAN) IV, polyethylene glycol, tiZANidine  Diet Orders (From admission, onward)     Start     Ordered   12/14/20 1148  Diet regular Room service appropriate? Yes; Fluid consistency: Thin  Diet effective now       Question Answer Comment  Room service appropriate? Yes   Fluid consistency: Thin      12/14/20 1147            DVT prophylaxis: enoxaparin (LOVENOX) injection 40 mg Start: 12/12/20 2230     Code Status: Full Code  Family Communication: no family at bedside   Status is: Inpatient  Remains inpatient appropriate because: intractable pain  Level of care: Med-Surg  Consultants:  Neurosurgery  IR  Procedures:  none  Microbiology  none  Antimicrobials:  none    Objective: Vitals:   12/14/20 1500 12/14/20 1524 12/14/20 1950 12/15/20 0826  BP:  (!) 146/72 (!) 144/77 (!) 158/82  Pulse:  90 89 97  Resp:  18 18 18   Temp: 97.6 F (36.4 C) 97.6 F (36.4 C) (!) 97.4 F (36.3 C) 97.9 F (36.6 C)  TempSrc:  Oral Oral Oral  SpO2:   97% 99% 98%  Weight:      Height:        Intake/Output Summary (Last 24 hours) at 12/15/2020 1008 Last data filed at 12/14/2020 1900 Gross per 24 hour  Intake 120 ml  Output 600 ml  Net -480 ml    Filed Weights   12/13/20 0425  Weight: 53 kg    Examination:  Constitutional: NAD Eyes: No scleral icterus ENMT: Moist mucous membranes Neck: normal, supple Respiratory: Clear to auscultation bilaterally without wheezing or crackles Cardiovascular: Regular rate and rhythm, no murmurs, no peripheral edema Abdomen: Soft, nontender, nondistended, bowel sounds positive Musculoskeletal: no clubbing / cyanosis.  Skin: No rashes seen Neurologic: No focal deficits   Data Reviewed: I have independently reviewed following labs and imaging studies   CBC: Recent Labs  Lab 12/12/20 1646 12/13/20 0306 12/14/20 0133 12/15/20 0101  WBC 9.5 6.9 11.0* 12.4*  NEUTROABS 6.8 4.4  --   --   HGB 15.5* 14.5 15.2* 16.4*  HCT 45.5 43.8 43.9 47.1*  MCV 92.7 94.8 91.3 91.6  PLT 474* 428* 412* 500*    Basic Metabolic Panel: Recent Labs  Lab 12/12/20 1646 12/13/20 0306 12/14/20 0133 12/15/20 0101  NA 139 137 137 138  K 3.3* 3.4* 4.4 4.3  CL 104 102 105 102  CO2 22 25 21* 24  GLUCOSE 104* 108* 108* 107*  BUN 8 9 8 10   CREATININE 0.84 0.94 0.92 0.91  CALCIUM 9.3 8.9 9.0 9.3  MG  --  2.3  --   --     Liver Function Tests: Recent Labs  Lab 12/12/20 1646 12/13/20 0306 12/14/20 0133  AST 29 23 24   ALT 23 21 19   ALKPHOS 86 83 83  BILITOT 0.9 0.5 0.7  PROT 6.7 6.2* 6.1*  ALBUMIN 3.7 3.2* 3.3*    Coagulation Profile: No results for input(s): INR, PROTIME in the last 168 hours. HbA1C: No results for input(s): HGBA1C in the last 72 hours. CBG: No results for input(s): GLUCAP in the last 168 hours.  Recent Results (from the past 240 hour(s))  Resp Panel by RT-PCR (Flu A&B, Covid) Nasopharyngeal Swab     Status: None   Collection Time: 12/13/20  3:55 AM   Specimen:  Nasopharyngeal Swab; Nasopharyngeal(NP) swabs in vial transport medium  Result Value Ref Range Status   SARS Coronavirus 2 by RT PCR NEGATIVE NEGATIVE Final    Comment: (NOTE) SARS-CoV-2 target nucleic acids are NOT DETECTED.  The SARS-CoV-2 RNA is generally detectable in upper respiratory specimens during the acute phase of infection. The lowest concentration of SARS-CoV-2 viral copies this assay can detect is 138 copies/mL. A negative result does not preclude SARS-Cov-2 infection and should not be used as the sole basis for treatment or other patient management decisions. A negative result may occur with  improper specimen collection/handling, submission of specimen other than nasopharyngeal swab, presence of viral mutation(s) within the areas targeted by this assay, and inadequate number of viral copies(<138 copies/mL). A negative result must be combined with clinical observations, patient history, and epidemiological information. The expected result is Negative.  Fact Sheet  for Patients:  EntrepreneurPulse.com.au  Fact Sheet for Healthcare Providers:  IncredibleEmployment.be  This test is no t yet approved or cleared by the [place] FDA and  has been authorized for detection and/or diagnosis of SARS-CoV-2 by FDA under an Emergency Use Authorization (EUA). This EUA will remain  in effect (meaning this test can be used) for the duration of the COVID-19 declaration under Section 564(b)(1) of the Act, 21 U.S.C.section 360bbb-3(b)(1), unless the authorization is terminated  or revoked sooner.       Influenza A by PCR NEGATIVE NEGATIVE Final   Influenza B by PCR NEGATIVE NEGATIVE Final    Comment: (NOTE) The Xpert Xpress SARS-CoV-2/FLU/RSV plus assay is intended as an aid in the diagnosis of influenza from Nasopharyngeal swab specimens and should not be used as a sole basis for treatment. Nasal washings and aspirates are unacceptable for  Xpert Xpress SARS-CoV-2/FLU/RSV testing.  Fact Sheet for Patients: EntrepreneurPulse.com.au  Fact Sheet for Healthcare Providers: IncredibleEmployment.be  This test is not yet approved or cleared by the [place] FDA and has been authorized for detection and/or diagnosis of SARS-CoV-2 by FDA under an Emergency Use Authorization (EUA). This EUA will remain in effect (meaning this test can be used) for the duration of the COVID-19 declaration under Section 564(b)(1) of the Act, 21 U.S.C. section 360bbb-3(b)(1), unless the authorization is terminated or revoked.  Performed at [facility], [place] [address] [place], [place] [address]   Urine Culture     Status: Abnormal (Preliminary result)   Collection Time: 12/13/20  3:59 AM   Specimen: Urine, Clean Catch  Result Value Ref Range Status   Specimen Description URINE, CLEAN CATCH  Final   Special Requests NONE  Final   Culture (A)  Final    >=100,000 COLONIES/mL KLEBSIELLA PNEUMONIAE 30,000 COLONIES/mL ENTEROCOCCUS FAECALIS SUSCEPTIBILITIES TO FOLLOW    Report Status PENDING  Incomplete   Organism ID, Bacteria KLEBSIELLA PNEUMONIAE (A)  Final      Susceptibility   Klebsiella pneumoniae - MIC*    AMPICILLIN >=32 RESISTANT Resistant     CEFAZOLIN <=4 SENSITIVE Sensitive     CEFEPIME <=0.12 SENSITIVE Sensitive     CEFTRIAXONE <=0.25 SENSITIVE Sensitive     CIPROFLOXACIN <=0.25 SENSITIVE Sensitive     GENTAMICIN <=1 SENSITIVE Sensitive     IMIPENEM <=0.25 SENSITIVE Sensitive     NITROFURANTOIN 128 RESISTANT Resistant     TRIMETH/SULFA <=20 SENSITIVE Sensitive     AMPICILLIN/SULBACTAM 4 SENSITIVE Sensitive     PIP/TAZO Value in next row Sensitive      <=4 SENSITIVEPerformed at [facility], [address], [place], [place] [address]    * >=100,000 COLONIES/mL KLEBSIELLA PNEUMONIAE      Radiology Studies: No results found.   Marzetta Board, MD, PhD [facility]  Between 7 am - 7 pm I am available, please contact me via Amion (for emergencies) or Securechat (non urgent messages)  Between 7 pm - 7 am I am not available, please contact night coverage MD/APP via Amion

## 2020-12-16 LAB — URINE CULTURE: Culture: >=100000 — AB

## 2020-12-16 NOTE — Progress Notes (Signed)
Mobility Specialist Progress Note:   12/16/20 0945  Mobility  Activity Ambulated in room  Level of Assistance Contact guard assist, steadying assist  Assistive Device Front wheel walker  RLE Weight Bearing WBAT  Distance Ambulated (ft) 20 ft  Mobility Ambulated with assistance in room  Mobility Response Tolerated well  Mobility performed by Mobility specialist  $Mobility charge 1 Mobility   Session premedicated. Pt with no c/o pain prior to session. Ambulated with contactG d/t LOBs and generalized weakness. Pt c/o significant bilateral buttock pain post-mobility. Back in bed with bed alarm on.  Nelta Numbers Mobility Specialist  Phone [phone]

## 2020-12-16 NOTE — Progress Notes (Signed)
PROGRESS NOTE  Carmen Rose [id number]:[id number] DOB: 1940-05-26 DOA: 12/12/2020 PCP: Ginger Organ., MD   LOS: 3 days   Brief Narrative / Interim history: 80 year old female with past medical history of osteoporosis, hyperlipidemia, mitral valve prolapse with recent diagnosis of nondisplaced fracture of right superior pubic ramus 11/25 after a fall who presents to [facility] with complaints of severe pelvic, left buttock and left leg pain as well as and urinary incontinence.  She was admitted 11/25-11/26 after mechanical fall and nondisplaced pelvic pubic ramus fracture.  Ortho saw her at that time and did not recommend any surgical interventions.  Apparently the day prior to admission she was sitting down on her sofa when she felt like she dropped a bit more than she should have.  She experienced worsening pain, unable to walk and came back to the hospital. CT imaging of the pelvis was performed revealing an unchanged minimally displaced bilateral zone 1 sacral alla fractures and unchanged alignment of the mildly displaced right anterior superior pubic foot fracture.  MRI of the lumbar spine was additionally performed that revealed grade 1 anterolisthesis at L4-L5 with moderate foraminal stenosis  Subjective / 24h Interval events: Remains with severe pain at times, still requiring IV pain medications  Assessment & Plan: Principal Problem:   Left buttock pain Active Problems:   Closed fracture of superior pubic ramus, right, initial encounter (HCC)   Anterolisthesis of lumbar spine   Urinary incontinence   Mixed hyperlipidemia   Closed zone 1 fracture of sacrum (HCC)   Closed zone 3 fracture of sacrum (HCC)  Principal Problem Pelvic pain, sacral fracture-due to sacral alla fracture mildly displaced right anterior superior pubic foot fracture.  She seems to be failing conservative management and had to be hospitalized again.  Continue pain medications, requiring IV morphine this  morning.  Neurosurgery evaluated patient without interventions needed -IR considering a sacroplasty pending insurance authorization, apparently denied and IR will appeal -PT/OT consulted, she will need SNF afterwards  Active Problems Urinary incontinence due to UTI-she has a degree of urinary incontinence but feels worse now.  Also has some lower abdominal discomfort.  WBC increased a little bit.  Urine cultures grew Klebsiella sensitive to cefazolin, continue Keflex.  Plan for 5 days.  Also shows Enterococcus but only 30,000 CFU's  Mixed hyperlipidemia - continue statin  Scheduled Meds:  cephALEXin  500 mg Oral Q12H   enoxaparin (LOVENOX) injection  40 mg Subcutaneous QHS    morphine injection  2 mg Intravenous Once   naproxen  500 mg Oral BID WC   pantoprazole  40 mg Oral Daily   [START ON 12/17/2020] rosuvastatin  10 mg Oral Q Sun   Continuous Infusions: PRN Meds:.acetaminophen **OR** acetaminophen, oxyCODONE-acetaminophen **OR** morphine injection, ondansetron **OR** ondansetron (ZOFRAN) IV, polyethylene glycol, tiZANidine  Diet Orders (From admission, onward)     Start     Ordered   12/14/20 1148  Diet regular Room service appropriate? Yes; Fluid consistency: Thin  Diet effective now       Question Answer Comment  Room service appropriate? Yes   Fluid consistency: Thin      12/14/20 1147            DVT prophylaxis: enoxaparin (LOVENOX) injection 40 mg Start: 12/12/20 2230     Code Status: Full Code  Family Communication: no family at bedside   Status is: Inpatient  Remains inpatient appropriate because: intractable pain  Level of care: Med-Surg  Consultants:  Neurosurgery  IR  Procedures:  none  Microbiology  none  Antimicrobials: none    Objective: Vitals:   12/15/20 1727 12/15/20 2000 12/16/20 0540 12/16/20 0849  BP: (!) 137/93 (!) 150/79 122/74 (!) 168/90  Pulse: 84 86 89 97  Resp: 18 18 16 19   Temp: 97.6 F (36.4 C) (!) 97.5 F (36.4 C)  97.7 F (36.5 C) (!) 97.5 F (36.4 C)  TempSrc: Oral Oral Oral Oral  SpO2: 92% 98% 97% 99%  Weight:      Height:        Intake/Output Summary (Last 24 hours) at 12/16/2020 1116 Last data filed at 12/16/2020 0541 Gross per 24 hour  Intake 100 ml  Output 750 ml  Net -650 ml    Filed Weights   12/13/20 0425  Weight: 53 kg    Examination:  Constitutional: No distress Respiratory: CTA, no wheezing, no crackles Cardiovascular: Heart is regular, no murmurs   Data Reviewed: I have independently reviewed following labs and imaging studies   CBC: Recent Labs  Lab 12/12/20 1646 12/13/20 0306 12/14/20 0133 12/15/20 0101  WBC 9.5 6.9 11.0* 12.4*  NEUTROABS 6.8 4.4  --   --   HGB 15.5* 14.5 15.2* 16.4*  HCT 45.5 43.8 43.9 47.1*  MCV 92.7 94.8 91.3 91.6  PLT 474* 428* 412* 500*    Basic Metabolic Panel: Recent Labs  Lab 12/12/20 1646 12/13/20 0306 12/14/20 0133 12/15/20 0101  NA 139 137 137 138  K 3.3* 3.4* 4.4 4.3  CL 104 102 105 102  CO2 22 25 21* 24  GLUCOSE 104* 108* 108* 107*  BUN 8 9 8 10   CREATININE 0.84 0.94 0.92 0.91  CALCIUM 9.3 8.9 9.0 9.3  MG  --  2.3  --   --     Liver Function Tests: Recent Labs  Lab 12/12/20 1646 12/13/20 0306 12/14/20 0133  AST 29 23 24   ALT 23 21 19   ALKPHOS 86 83 83  BILITOT 0.9 0.5 0.7  PROT 6.7 6.2* 6.1*  ALBUMIN 3.7 3.2* 3.3*    Coagulation Profile: No results for input(s): INR, PROTIME in the last 168 hours. HbA1C: No results for input(s): HGBA1C in the last 72 hours. CBG: No results for input(s): GLUCAP in the last 168 hours.  Recent Results (from the past 240 hour(s))  Resp Panel by RT-PCR (Flu A&B, Covid) Nasopharyngeal Swab     Status: None   Collection Time: 12/13/20  3:55 AM   Specimen: Nasopharyngeal Swab; Nasopharyngeal(NP) swabs in vial transport medium  Result Value Ref Range Status   SARS Coronavirus 2 by RT PCR NEGATIVE NEGATIVE Final    Comment: (NOTE) SARS-CoV-2 target nucleic acids are  NOT DETECTED.  The SARS-CoV-2 RNA is generally detectable in upper respiratory specimens during the acute phase of infection. The lowest concentration of SARS-CoV-2 viral copies this assay can detect is 138 copies/mL. A negative result does not preclude SARS-Cov-2 infection and should not be used as the sole basis for treatment or other patient management decisions. A negative result may occur with  improper specimen collection/handling, submission of specimen other than nasopharyngeal swab, presence of viral mutation(s) within the areas targeted by this assay, and inadequate number of viral copies(<138 copies/mL). A negative result must be combined with clinical observations, patient history, and epidemiological information. The expected result is Negative.  Fact Sheet for Patients:  EntrepreneurPulse.com.au  Fact Sheet for Healthcare Providers:  IncredibleEmployment.be  This test is no t yet approved or cleared by the [place]  FDA and  has been authorized for detection and/or diagnosis of SARS-CoV-2 by FDA under an Emergency Use Authorization (EUA). This EUA will remain  in effect (meaning this test can be used) for the duration of the COVID-19 declaration under Section 564(b)(1) of the Act, 21 U.S.C.section 360bbb-3(b)(1), unless the authorization is terminated  or revoked sooner.       Influenza A by PCR NEGATIVE NEGATIVE Final   Influenza B by PCR NEGATIVE NEGATIVE Final    Comment: (NOTE) The Xpert Xpress SARS-CoV-2/FLU/RSV plus assay is intended as an aid in the diagnosis of influenza from Nasopharyngeal swab specimens and should not be used as a sole basis for treatment. Nasal washings and aspirates are unacceptable for Xpert Xpress SARS-CoV-2/FLU/RSV testing.  Fact Sheet for Patients: EntrepreneurPulse.com.au  Fact Sheet for Healthcare Providers: IncredibleEmployment.be  This test is not  yet approved or cleared by the [place] FDA and has been authorized for detection and/or diagnosis of SARS-CoV-2 by FDA under an Emergency Use Authorization (EUA). This EUA will remain in effect (meaning this test can be used) for the duration of the COVID-19 declaration under Section 564(b)(1) of the Act, 21 U.S.C. section 360bbb-3(b)(1), unless the authorization is terminated or revoked.  Performed at [facility], [place] [address], [place], [place] [address]   Urine Culture     Status: Abnormal   Collection Time: 12/13/20  3:59 AM   Specimen: Urine, Clean Catch  Result Value Ref Range Status   Specimen Description URINE, CLEAN CATCH  Final   Special Requests   Final    NONE Performed at [facility], [place] [address] [place], [place] [address]    Culture (A)  Final    >=100,000 COLONIES/mL KLEBSIELLA PNEUMONIAE 30,000 COLONIES/mL ENTEROCOCCUS FAECALIS    Report Status 12/16/2020 FINAL  Final   Organism ID, Bacteria KLEBSIELLA PNEUMONIAE (A)  Final   Organism ID, Bacteria ENTEROCOCCUS FAECALIS (A)  Final      Susceptibility   Enterococcus faecalis - MIC*    AMPICILLIN <=2 SENSITIVE Sensitive     NITROFURANTOIN <=16 SENSITIVE Sensitive     VANCOMYCIN 1 SENSITIVE Sensitive     * 30,000 COLONIES/mL ENTEROCOCCUS FAECALIS   Klebsiella pneumoniae - MIC*    AMPICILLIN >=32 RESISTANT Resistant     CEFAZOLIN <=4 SENSITIVE Sensitive     CEFEPIME <=0.12 SENSITIVE Sensitive     CEFTRIAXONE <=0.25 SENSITIVE Sensitive     CIPROFLOXACIN <=0.25 SENSITIVE Sensitive     GENTAMICIN <=1 SENSITIVE Sensitive     IMIPENEM <=0.25 SENSITIVE Sensitive     NITROFURANTOIN 128 RESISTANT Resistant     TRIMETH/SULFA <=20 SENSITIVE Sensitive     AMPICILLIN/SULBACTAM 4 SENSITIVE Sensitive     PIP/TAZO <=4 SENSITIVE Sensitive     * >=100,000 COLONIES/mL KLEBSIELLA PNEUMONIAE      Radiology Studies: No results found.   Marzetta Board, MD, PhD [facility]  Between 7 am - 7  pm I am available, please contact me via Amion (for emergencies) or Securechat (non urgent messages)  Between 7 pm - 7 am I am not available, please contact night coverage MD/APP via Amion

## 2020-12-17 NOTE — Progress Notes (Signed)
Mobility Specialist Progress Note:   12/17/20 0920  Mobility  Activity Ambulated in room  Level of Assistance Moderate assist, patient does 50-74%  Assistive Device Front wheel walker  RLE Weight Bearing WBAT  Distance Ambulated (ft) 30 ft  Mobility Ambulated with assistance in room  Mobility Response Tolerated well  Mobility performed by Mobility specialist  $Mobility charge 1 Mobility   Pt with no complaints of pain during session. Pt requiring supervision for bed mobility, cues for hand placement to stand, and min guard during ambulation. Pt with increased instability when taking lateral steps up towards [place] requiring modA to correct. Back in bed with bed alarm on.   Nelta Numbers Mobility Specialist  Phone [phone]

## 2020-12-17 NOTE — Progress Notes (Signed)
PROGRESS NOTE  Carmen Rose [id number] DOB: Dec 03, 1940 DOA: 12/12/2020 PCP: Ginger Organ., MD   LOS: 4 days   Brief Narrative / Interim history: 80 year old female with past medical history of osteoporosis, hyperlipidemia, mitral valve prolapse with recent diagnosis of nondisplaced fracture of right superior pubic ramus 11/25 after a fall who presents to [facility] with complaints of severe pelvic, left buttock and left leg pain as well as and urinary incontinence.  She was admitted 11/25-11/26 after mechanical fall and nondisplaced pelvic pubic ramus fracture.  Ortho saw her at that time and did not recommend any surgical interventions.  Apparently the day prior to admission she was sitting down on her sofa when she felt like she dropped a bit more than she should have.  She experienced worsening pain, unable to walk and came back to the hospital. CT imaging of the pelvis was performed revealing an unchanged minimally displaced bilateral zone 1 sacral alla fractures and unchanged alignment of the mildly displaced right anterior superior pubic foot fracture.  MRI of the lumbar spine was additionally performed that revealed grade 1 anterolisthesis at L4-L5 with moderate foraminal stenosis  Subjective / 24h Interval events: She is frustrated this morning, asked for help about 45 minutes ago she is still waiting.  Remains with intermittent pain requiring IV pain medications  Assessment & Plan: Principal Problem:   Left buttock pain Active Problems:   Closed fracture of superior pubic ramus, right, initial encounter (HCC)   Anterolisthesis of lumbar spine   Urinary incontinence   Mixed hyperlipidemia   Closed zone 1 fracture of sacrum (HCC)   Closed zone 3 fracture of sacrum (HCC)  Principal Problem Pelvic pain, sacral fracture-due to sacral alla fracture mildly displaced right anterior superior pubic foot fracture.  She seems to be failing conservative management and had  to be hospitalized again.  Continue pain medications, requiring IV morphine this morning.  Neurosurgery evaluated patient without interventions needed -IR considering a sacroplasty pending insurance authorization, apparently denied and IR will appeal -PT/OT consulted, she will need SNF afterwards  Active Problems Urinary incontinence due to UTI-she has a degree of urinary incontinence but feels worse now.  Also has some lower abdominal discomfort.  WBC increased a little bit.  Urine cultures grew Klebsiella sensitive to cefazolin, status post a course of Keflex.  Also shows Enterococcus but only 30,000 CFU's  Mixed hyperlipidemia - continue statin  Scheduled Meds:  enoxaparin (LOVENOX) injection  40 mg Subcutaneous QHS    morphine injection  2 mg Intravenous Once   naproxen  500 mg Oral BID WC   pantoprazole  40 mg Oral Daily   rosuvastatin  10 mg Oral Q Sun   Continuous Infusions: PRN Meds:.acetaminophen **OR** acetaminophen, oxyCODONE-acetaminophen **OR** morphine injection, ondansetron **OR** ondansetron (ZOFRAN) IV, polyethylene glycol, tiZANidine  Diet Orders (From admission, onward)     Start     Ordered   12/14/20 1148  Diet regular Room service appropriate? Yes; Fluid consistency: Thin  Diet effective now       Question Answer Comment  Room service appropriate? Yes   Fluid consistency: Thin      12/14/20 1147            DVT prophylaxis: enoxaparin (LOVENOX) injection 40 mg Start: 12/12/20 2230     Code Status: Full Code  Family Communication: no family at bedside   Status is: Inpatient  Remains inpatient appropriate because: intractable pain  Level of care: Med-Surg  Consultants:  Neurosurgery  IR  Procedures:  none  Microbiology  none  Antimicrobials: none    Objective: Vitals:   12/16/20 1612 12/16/20 2052 12/17/20 0540 12/17/20 0939  BP: (!) 169/90 (!) 143/80 133/78 138/80  Pulse: 97 82 88 93  Resp: 18 16 18 17   Temp: 98.1 F (36.7 C)  97.7 F (36.5 C) 97.6 F (36.4 C) 97.7 F (36.5 C)  TempSrc: Oral Oral Oral Oral  SpO2: 100% 97% 97% 99%  Weight:      Height:        Intake/Output Summary (Last 24 hours) at 12/17/2020 1049 Last data filed at 12/17/2020 0900 Gross per 24 hour  Intake 240 ml  Output 1350 ml  Net -1110 ml    Filed Weights   12/13/20 0425  Weight: 53 kg    Examination:  Constitutional: NAD   Data Reviewed: I have independently reviewed following labs and imaging studies   CBC: Recent Labs  Lab 12/12/20 1646 12/13/20 0306 12/14/20 0133 12/15/20 0101  WBC 9.5 6.9 11.0* 12.4*  NEUTROABS 6.8 4.4  --   --   HGB 15.5* 14.5 15.2* 16.4*  HCT 45.5 43.8 43.9 47.1*  MCV 92.7 94.8 91.3 91.6  PLT 474* 428* 412* 500*    Basic Metabolic Panel: Recent Labs  Lab 12/12/20 1646 12/13/20 0306 12/14/20 0133 12/15/20 0101  NA 139 137 137 138  K 3.3* 3.4* 4.4 4.3  CL 104 102 105 102  CO2 22 25 21* 24  GLUCOSE 104* 108* 108* 107*  BUN 8 9 8 10   CREATININE 0.84 0.94 0.92 0.91  CALCIUM 9.3 8.9 9.0 9.3  MG  --  2.3  --   --     Liver Function Tests: Recent Labs  Lab 12/12/20 1646 12/13/20 0306 12/14/20 0133  AST 29 23 24   ALT 23 21 19   ALKPHOS 86 83 83  BILITOT 0.9 0.5 0.7  PROT 6.7 6.2* 6.1*  ALBUMIN 3.7 3.2* 3.3*    Coagulation Profile: No results for input(s): INR, PROTIME in the last 168 hours. HbA1C: No results for input(s): HGBA1C in the last 72 hours. CBG: No results for input(s): GLUCAP in the last 168 hours.  Recent Results (from the past 240 hour(s))  Resp Panel by RT-PCR (Flu A&B, Covid) Nasopharyngeal Swab     Status: None   Collection Time: 12/13/20  3:55 AM   Specimen: Nasopharyngeal Swab; Nasopharyngeal(NP) swabs in vial transport medium  Result Value Ref Range Status   SARS Coronavirus 2 by RT PCR NEGATIVE NEGATIVE Final    Comment: (NOTE) SARS-CoV-2 target nucleic acids are NOT DETECTED.  The SARS-CoV-2 RNA is generally detectable in upper  respiratory specimens during the acute phase of infection. The lowest concentration of SARS-CoV-2 viral copies this assay can detect is 138 copies/mL. A negative result does not preclude SARS-Cov-2 infection and should not be used as the sole basis for treatment or other patient management decisions. A negative result may occur with  improper specimen collection/handling, submission of specimen other than nasopharyngeal swab, presence of viral mutation(s) within the areas targeted by this assay, and inadequate number of viral copies(<138 copies/mL). A negative result must be combined with clinical observations, patient history, and epidemiological information. The expected result is Negative.  Fact Sheet for Patients:  EntrepreneurPulse.com.au  Fact Sheet for Healthcare Providers:  IncredibleEmployment.be  This test is no t yet approved or cleared by the [place] FDA and  has been authorized for detection and/or diagnosis of SARS-CoV-2 by FDA  under an Emergency Use Authorization (EUA). This EUA will remain  in effect (meaning this test can be used) for the duration of the COVID-19 declaration under Section 564(b)(1) of the Act, 21 U.S.C.section 360bbb-3(b)(1), unless the authorization is terminated  or revoked sooner.       Influenza A by PCR NEGATIVE NEGATIVE Final   Influenza B by PCR NEGATIVE NEGATIVE Final    Comment: (NOTE) The Xpert Xpress SARS-CoV-2/FLU/RSV plus assay is intended as an aid in the diagnosis of influenza from Nasopharyngeal swab specimens and should not be used as a sole basis for treatment. Nasal washings and aspirates are unacceptable for Xpert Xpress SARS-CoV-2/FLU/RSV testing.  Fact Sheet for Patients: EntrepreneurPulse.com.au  Fact Sheet for Healthcare Providers: IncredibleEmployment.be  This test is not yet approved or cleared by the [place] FDA and has been  authorized for detection and/or diagnosis of SARS-CoV-2 by FDA under an Emergency Use Authorization (EUA). This EUA will remain in effect (meaning this test can be used) for the duration of the COVID-19 declaration under Section 564(b)(1) of the Act, 21 U.S.C. section 360bbb-3(b)(1), unless the authorization is terminated or revoked.  Performed at [facility], [place] [address], [place], [place] [address]   Urine Culture     Status: Abnormal   Collection Time: 12/13/20  3:59 AM   Specimen: Urine, Clean Catch  Result Value Ref Range Status   Specimen Description URINE, CLEAN CATCH  Final   Special Requests   Final    NONE Performed at [facility], [place] [address] [place], [place] [address]    Culture (A)  Final    >=100,000 COLONIES/mL KLEBSIELLA PNEUMONIAE 30,000 COLONIES/mL ENTEROCOCCUS FAECALIS    Report Status 12/16/2020 FINAL  Final   Organism ID, Bacteria KLEBSIELLA PNEUMONIAE (A)  Final   Organism ID, Bacteria ENTEROCOCCUS FAECALIS (A)  Final      Susceptibility   Enterococcus faecalis - MIC*    AMPICILLIN <=2 SENSITIVE Sensitive     NITROFURANTOIN <=16 SENSITIVE Sensitive     VANCOMYCIN 1 SENSITIVE Sensitive     * 30,000 COLONIES/mL ENTEROCOCCUS FAECALIS   Klebsiella pneumoniae - MIC*    AMPICILLIN >=32 RESISTANT Resistant     CEFAZOLIN <=4 SENSITIVE Sensitive     CEFEPIME <=0.12 SENSITIVE Sensitive     CEFTRIAXONE <=0.25 SENSITIVE Sensitive     CIPROFLOXACIN <=0.25 SENSITIVE Sensitive     GENTAMICIN <=1 SENSITIVE Sensitive     IMIPENEM <=0.25 SENSITIVE Sensitive     NITROFURANTOIN 128 RESISTANT Resistant     TRIMETH/SULFA <=20 SENSITIVE Sensitive     AMPICILLIN/SULBACTAM 4 SENSITIVE Sensitive     PIP/TAZO <=4 SENSITIVE Sensitive     * >=100,000 COLONIES/mL KLEBSIELLA PNEUMONIAE      Radiology Studies: No results found.   Marzetta Board, MD, PhD [facility]  Between 7 am - 7 pm I am available, please contact me via Amion (for  emergencies) or Securechat (non urgent messages)  Between 7 pm - 7 am I am not available, please contact night coverage MD/APP via Amion

## 2020-12-18 NOTE — TOC Initial Note (Signed)
Transition of Care [facility]) - Initial/Assessment Note    Patient Details  Name: Carmen Rose MRN: [id number] Date of Birth: [birth year]  Transition of Care [facility]) CM/SW Contact:    Emeterio Reeve, LCSW Phone Number: 12/18/2020, 4:45 PM  Clinical Narrative:                 CSW received SNF consult. CSW met with pt at bedside. CSW introduced self and explained role at the hospital. Pt reports that PTA she lived at home alone. Pt states she was independent with mobility and ADL's. Pt reports she was driving and working a part time job.   Pt is undecided if she wants SNF. Pt will like to find out if she will be having surgery or not before deciding about SNF. MD states that insurance Josem Kaufmann was denied for surgery but they are submitting an appeal.   CSW will continue to follow.   Expected Discharge Plan: Skilled Nursing Facility Barriers to Discharge: Continued Medical Work up, Ship broker   Patient Goals and CMS Choice Patient states their goals for this hospitalization and ongoing recovery are:: to get better CMS Medicare.gov Compare Post Acute Care list provided to:: Patient Choice offered to / list presented to : Patient  Expected Discharge Plan and Services Expected Discharge Plan: [place]       Living arrangements for the past 2 months: Single Family Home                                      Prior Living Arrangements/Services Living arrangements for the past 2 months: Single Family Home Lives with:: Self Patient language and need for interpreter reviewed:: Yes Do you feel safe going back to the place where you live?: Yes      Need for Family Participation in Patient Care: Yes (Comment) Care giver support system in place?: Yes (comment)   Criminal Activity/Legal Involvement Pertinent to Current Situation/Hospitalization: No - Comment as needed  Activities of Daily Living Home Assistive Devices/Equipment: None ADL Screening  (condition at time of admission) Patient's cognitive ability adequate to safely complete daily activities?: Yes Is the patient deaf or have difficulty hearing?: No Does the patient have difficulty seeing, even when wearing glasses/contacts?: No Does the patient have difficulty concentrating, remembering, or making decisions?: No Patient able to express need for assistance with ADLs?: Yes Does the patient have difficulty dressing or bathing?: No Independently performs ADLs?: Yes (appropriate for developmental age) Does the patient have difficulty walking or climbing stairs?: No Weakness of Legs: None Weakness of Arms/Hands: None  Permission Sought/Granted Permission sought to share information with : Facility Art therapist granted to share information with : Yes, Verbal Permission Granted  Share Information with NAME: [place], Quarry   [phone]  Permission granted to share info w AGENCY: SNF  Permission granted to share info w Relationship: Gwendolyne, Welford   [phone]  Permission granted to share info w Contact Information: Joycie, Aerts   [phone]  Emotional Assessment Appearance:: Appears stated age Attitude/Demeanor/Rapport: Engaged Affect (typically observed): Appropriate Orientation: : Oriented to Self, Oriented to Place, Oriented to  Time, Oriented to Situation Alcohol / Substance Use: Not Applicable Psych Involvement: No (comment)  Admission diagnosis:  Lumbar radiculopathy [M54.16] Left buttock pain [M79.18] Acute pelvic pain, female [R10.2] Pelvic pain [R10.2] Constipation [K59.00] Constipation, unspecified constipation type [K59.00] Closed fracture of sacrum, unspecified portion of sacrum, initial encounter (  [place]) [S32.10XA] Patient Active Problem List   Diagnosis Date Noted   Left buttock pain 12/13/2020   Closed zone 1 fracture of sacrum ([place]) 12/13/2020   Closed zone 3 fracture of sacrum ([place]) 12/13/2020    Anterolisthesis of lumbar spine 12/12/2020   Urinary incontinence 12/12/2020   Mixed hyperlipidemia 12/12/2020   Closed fracture of superior pubic ramus, right, initial encounter ([place]) 12/02/2020   PCP:  Ginger Organ., MD Pharmacy:   [facility], [address] [place] [address] Phone: [phone] Fax: [phone]     Social Determinants of Health (SDOH) Interventions    Readmission Risk Interventions No flowsheet data found.  Emeterio Reeve, LCSW Clinical Social Worker

## 2020-12-18 NOTE — Progress Notes (Signed)
Mobility Specialist Progress Note    12/18/20 1634  Mobility  Activity Ambulated to bathroom  Level of Assistance Minimal assist, patient does 75% or more  Assistive Device Front wheel walker  RLE Weight Bearing WBAT  Distance Ambulated (ft) 20 ft  Mobility Out of bed for toileting  Mobility Response Tolerated fair  Mobility performed by Mobility specialist  Bed Position Chair  $Mobility charge 1 Mobility   Pt received in bed. C/o significant pain ambulating to bathroom. Attempted BM but says she feels constipated. Returned to chair with call bell in reach.   [facility] Mobility Specialist  M.S. Primary Phone: [phone] M.S. Secondary Phone: [phone]

## 2020-12-18 NOTE — Progress Notes (Signed)
PROGRESS NOTE  Carmen Rose ZOX:[id number] DOB: June 26, 1940 DOA: 12/12/2020 PCP: Ginger Organ., MD   LOS: 5 days   Brief Narrative / Interim history: 80 year old female with past medical history of osteoporosis, hyperlipidemia, mitral valve prolapse with recent diagnosis of nondisplaced fracture of right superior pubic ramus 11/25 after a fall who presents to [facility] with complaints of severe pelvic, left buttock and left leg pain as well as and urinary incontinence.  She was admitted 11/25-11/26 after mechanical fall and nondisplaced pelvic pubic ramus fracture.  Ortho saw her at that time and did not recommend any surgical interventions.  Apparently the day prior to admission she was sitting down on her sofa when she felt like she dropped a bit more than she should have.  She experienced worsening pain, unable to walk and came back to the hospital. CT imaging of the pelvis was performed revealing an unchanged minimally displaced bilateral zone 1 sacral alla fractures and unchanged alignment of the mildly displaced right anterior superior pubic foot fracture.  MRI of the lumbar spine was additionally performed that revealed grade 1 anterolisthesis at L4-L5 with moderate foraminal stenosis  Subjective / 24h Interval events: She is in pain.  Anxious to see what IR will say today  Assessment & Plan: Principal Problem:   Left buttock pain Active Problems:   Closed fracture of superior pubic ramus, right, initial encounter (HCC)   Anterolisthesis of lumbar spine   Urinary incontinence   Mixed hyperlipidemia   Closed zone 1 fracture of sacrum (HCC)   Closed zone 3 fracture of sacrum (HCC)  Principal Problem Pelvic pain, sacral fracture-due to sacral alla fracture mildly displaced right anterior superior pubic foot fracture.  She seems to be failing conservative management and had to be hospitalized again.  Continue pain medications, requiring IV morphine this morning.   Neurosurgery evaluated patient without interventions needed -IR considering a sacroplasty pending insurance authorization, denied, IR will appeal, awaiting hear the appeal results -PT/OT consulted, she will need SNF afterwards  Active Problems Urinary incontinence due to UTI-she has a degree of urinary incontinence but feels worse now.  Also has some lower abdominal discomfort.  WBC increased a little bit.  Urine cultures grew Klebsiella sensitive to cefazolin, status post a course of Keflex.  Also shows Enterococcus but only 30,000 CFU's  Mixed hyperlipidemia - continue statin  Scheduled Meds:  enoxaparin (LOVENOX) injection  40 mg Subcutaneous QHS    morphine injection  2 mg Intravenous Once   naproxen  500 mg Oral BID WC   rosuvastatin  10 mg Oral Q Sun   Continuous Infusions: PRN Meds:.acetaminophen **OR** acetaminophen, oxyCODONE-acetaminophen **OR** morphine injection, ondansetron **OR** ondansetron (ZOFRAN) IV, polyethylene glycol, tiZANidine  Diet Orders (From admission, onward)     Start     Ordered   12/14/20 1148  Diet regular Room service appropriate? Yes; Fluid consistency: Thin  Diet effective now       Question Answer Comment  Room service appropriate? Yes   Fluid consistency: Thin      12/14/20 1147            DVT prophylaxis: enoxaparin (LOVENOX) injection 40 mg Start: 12/12/20 2230     Code Status: Full Code  Family Communication: no family at bedside   Status is: Inpatient  Remains inpatient appropriate because: intractable pain  Level of care: Med-Surg  Consultants:  Neurosurgery  IR  Procedures:  none  Microbiology  none  Antimicrobials: none  Objective: Vitals:   12/17/20 1501 12/17/20 2004 12/18/20 0601 12/18/20 0840  BP: (!) 155/84 (!) 151/81 (!) 143/86 (!) 128/53  Pulse: 80 77 92 93  Resp: 16 16 18 18   Temp: (!) 97.4 F (36.3 C) 97.7 F (36.5 C) 97.8 F (36.6 C) 97.9 F (36.6 C)  TempSrc: Oral Oral Oral Oral  SpO2:  100% 99% 97% 95%  Weight:      Height:        Intake/Output Summary (Last 24 hours) at 12/18/2020 1046 Last data filed at 12/18/2020 0602 Gross per 24 hour  Intake 240 ml  Output 1050 ml  Net -810 ml    Filed Weights   12/13/20 0425  Weight: 53 kg    Examination:  Constitutional: NAD Respiratory: clear to auscultation bilaterally, no wheezing, no crackles. Normal respiratory effort.  Cardiovascular: Regular rate and rhythm, no murmurs / rubs / gallops. No LE edema.  Data Reviewed: I have independently reviewed following labs and imaging studies   CBC: Recent Labs  Lab 12/12/20 1646 12/13/20 0306 12/14/20 0133 12/15/20 0101  WBC 9.5 6.9 11.0* 12.4*  NEUTROABS 6.8 4.4  --   --   HGB 15.5* 14.5 15.2* 16.4*  HCT 45.5 43.8 43.9 47.1*  MCV 92.7 94.8 91.3 91.6  PLT 474* 428* 412* 500*    Basic Metabolic Panel: Recent Labs  Lab 12/12/20 1646 12/13/20 0306 12/14/20 0133 12/15/20 0101  NA 139 137 137 138  K 3.3* 3.4* 4.4 4.3  CL 104 102 105 102  CO2 22 25 21* 24  GLUCOSE 104* 108* 108* 107*  BUN 8 9 8 10   CREATININE 0.84 0.94 0.92 0.91  CALCIUM 9.3 8.9 9.0 9.3  MG  --  2.3  --   --     Liver Function Tests: Recent Labs  Lab 12/12/20 1646 12/13/20 0306 12/14/20 0133  AST 29 23 24   ALT 23 21 19   ALKPHOS 86 83 83  BILITOT 0.9 0.5 0.7  PROT 6.7 6.2* 6.1*  ALBUMIN 3.7 3.2* 3.3*    Coagulation Profile: No results for input(s): INR, PROTIME in the last 168 hours. HbA1C: No results for input(s): HGBA1C in the last 72 hours. CBG: No results for input(s): GLUCAP in the last 168 hours.  Recent Results (from the past 240 hour(s))  Resp Panel by RT-PCR (Flu A&B, Covid) Nasopharyngeal Swab     Status: None   Collection Time: 12/13/20  3:55 AM   Specimen: Nasopharyngeal Swab; Nasopharyngeal(NP) swabs in vial transport medium  Result Value Ref Range Status   SARS Coronavirus 2 by RT PCR NEGATIVE NEGATIVE Final    Comment: (NOTE) SARS-CoV-2 target nucleic  acids are NOT DETECTED.  The SARS-CoV-2 RNA is generally detectable in upper respiratory specimens during the acute phase of infection. The lowest concentration of SARS-CoV-2 viral copies this assay can detect is 138 copies/mL. A negative result does not preclude SARS-Cov-2 infection and should not be used as the sole basis for treatment or other patient management decisions. A negative result may occur with  improper specimen collection/handling, submission of specimen other than nasopharyngeal swab, presence of viral mutation(s) within the areas targeted by this assay, and inadequate number of viral copies(<138 copies/mL). A negative result must be combined with clinical observations, patient history, and epidemiological information. The expected result is Negative.  Fact Sheet for Patients:  EntrepreneurPulse.com.au  Fact Sheet for Healthcare Providers:  IncredibleEmployment.be  This test is no t yet approved or cleared by the [place] and  has been authorized for detection and/or diagnosis of SARS-CoV-2 by FDA under an Emergency Use Authorization (EUA). This EUA will remain  in effect (meaning this test can be used) for the duration of the COVID-19 declaration under Section 564(b)(1) of the Act, 21 U.S.C.section 360bbb-3(b)(1), unless the authorization is terminated  or revoked sooner.       Influenza A by PCR NEGATIVE NEGATIVE Final   Influenza B by PCR NEGATIVE NEGATIVE Final    Comment: (NOTE) The Xpert Xpress SARS-CoV-2/FLU/RSV plus assay is intended as an aid in the diagnosis of influenza from Nasopharyngeal swab specimens and should not be used as a sole basis for treatment. Nasal washings and aspirates are unacceptable for Xpert Xpress SARS-CoV-2/FLU/RSV testing.  Fact Sheet for Patients: EntrepreneurPulse.com.au  Fact Sheet for Healthcare Providers: IncredibleEmployment.be  This  test is not yet approved or cleared by the [place] FDA and has been authorized for detection and/or diagnosis of SARS-CoV-2 by FDA under an Emergency Use Authorization (EUA). This EUA will remain in effect (meaning this test can be used) for the duration of the COVID-19 declaration under Section 564(b)(1) of the Act, 21 U.S.C. section 360bbb-3(b)(1), unless the authorization is terminated or revoked.  Performed at [facility], [place] [address] [place], [place] [address]   Urine Culture     Status: Abnormal   Collection Time: 12/13/20  3:59 AM   Specimen: Urine, Clean Catch  Result Value Ref Range Status   Specimen Description URINE, CLEAN CATCH  Final   Special Requests   Final    NONE Performed at [facility], [place] [address] [place], [place] [address]    Culture (A)  Final    >=100,000 COLONIES/mL KLEBSIELLA PNEUMONIAE 30,000 COLONIES/mL ENTEROCOCCUS FAECALIS    Report Status 12/16/2020 FINAL  Final   Organism ID, Bacteria KLEBSIELLA PNEUMONIAE (A)  Final   Organism ID, Bacteria ENTEROCOCCUS FAECALIS (A)  Final      Susceptibility   Enterococcus faecalis - MIC*    AMPICILLIN <=2 SENSITIVE Sensitive     NITROFURANTOIN <=16 SENSITIVE Sensitive     VANCOMYCIN 1 SENSITIVE Sensitive     * 30,000 COLONIES/mL ENTEROCOCCUS FAECALIS   Klebsiella pneumoniae - MIC*    AMPICILLIN >=32 RESISTANT Resistant     CEFAZOLIN <=4 SENSITIVE Sensitive     CEFEPIME <=0.12 SENSITIVE Sensitive     CEFTRIAXONE <=0.25 SENSITIVE Sensitive     CIPROFLOXACIN <=0.25 SENSITIVE Sensitive     GENTAMICIN <=1 SENSITIVE Sensitive     IMIPENEM <=0.25 SENSITIVE Sensitive     NITROFURANTOIN 128 RESISTANT Resistant     TRIMETH/SULFA <=20 SENSITIVE Sensitive     AMPICILLIN/SULBACTAM 4 SENSITIVE Sensitive     PIP/TAZO <=4 SENSITIVE Sensitive     * >=100,000 COLONIES/mL KLEBSIELLA PNEUMONIAE      Radiology Studies: No results found.   Marzetta Board, MD, PhD [facility]  Between 7 am - 7 pm I am available, please contact me via Amion (for emergencies) or Securechat (non urgent messages)  Between 7 pm - 7 am I am not available, please contact night coverage MD/APP via Amion

## 2020-12-19 NOTE — Progress Notes (Signed)
Mobility Specialist Progress Note:   12/19/20 1020  Mobility  Activity Ambulated in room  Level of Assistance Contact guard assist, steadying assist  Assistive Device Front wheel walker  Distance Ambulated (ft) 50 ft  Mobility Ambulated with assistance in room  Mobility Response Tolerated well  Mobility performed by Mobility specialist  $Mobility charge 1 Mobility   Pt asx during ambulation. No c/o pain during session. Pt feels more stable with personal walker from home with tennis balls on back. Back in bed with bed alarm on.  Nelta Numbers Mobility Specialist  Phone [phone]

## 2020-12-19 NOTE — Plan of Care (Signed)
  Problem: Education: Goal: Knowledge of General Education information will improve Description: Including pain rating scale, medication(s)/side effects and non-pharmacologic comfort measures Outcome: Progressing   Problem: Clinical Measurements: Goal: Will remain free from infection Outcome: Progressing Goal: Diagnostic test results will improve Outcome: Progressing Goal: Cardiovascular complication will be avoided Outcome: Progressing   Problem: Activity: Goal: Risk for activity intolerance will decrease Outcome: Progressing   

## 2020-12-19 NOTE — Progress Notes (Addendum)
PROGRESS NOTE  Carmen Rose GYJ:[id number] DOB: 09-17-1940 DOA: 12/12/2020 PCP: Ginger Organ., MD   LOS: 6 days   Brief Narrative / Interim history: 80 year old female with past medical history of osteoporosis, hyperlipidemia, mitral valve prolapse with recent diagnosis of nondisplaced fracture of right superior pubic ramus 11/25 after a fall who presents to [facility] with complaints of severe pelvic, left buttock and left leg pain as well as and urinary incontinence.  She was admitted 11/25-11/26 after mechanical fall and nondisplaced pelvic pubic ramus fracture.  Ortho saw her at that time and did not recommend any surgical interventions.  Apparently the day prior to admission she was sitting down on her sofa when she felt like she dropped a bit more than she should have.  She experienced worsening pain, unable to walk and came back to the hospital. CT imaging of the pelvis was performed revealing an unchanged minimally displaced bilateral zone 1 sacral alla fractures and unchanged alignment of the mildly displaced right anterior superior pubic foot fracture.  MRI of the lumbar spine was additionally performed that revealed grade 1 anterolisthesis at L4-L5 with moderate foraminal stenosis.  IR consulted and sacroplasty was planned but insurance denied, currently appeal is in process  Subjective / 24h Interval events: Her pain is a little bit better today.  She has not used IV morphine since yesterday.  Still awaiting to hear back from IR the peer to peer decision regarding her sacroplasty  Assessment & Plan: Principal Problem:   Left buttock pain Active Problems:   Closed fracture of superior pubic ramus, right, initial encounter (HCC)   Anterolisthesis of lumbar spine   Urinary incontinence   Mixed hyperlipidemia   Closed zone 1 fracture of sacrum (HCC)   Closed zone 3 fracture of sacrum (HCC)  Principal Problem Pelvic pain, sacral fracture-due to sacral alla fracture  mildly displaced right anterior superior pubic foot fracture.  She seems to be failing conservative management and had to be hospitalized again.  Continue pain medications, requiring IV morphine this morning.  Neurosurgery evaluated patient without interventions needed -IR considering a sacroplasty pending insurance authorization, denied, IR will appeal, awaiting to hear to appeal results, contacted IR and they will update [place] later today -PT/OT consulted, she will need SNF afterwards  Active Problems Urinary incontinence due to UTI-she has a degree of urinary incontinence but feels worse now.  Also has some lower abdominal discomfort.  WBC increased a little bit.  Urine cultures grew Klebsiella sensitive to cefazolin, status post a course of Keflex.  Also shows Enterococcus but only 30,000 CFU's.  Stable today  Mixed hyperlipidemia - continue statin  Scheduled Meds:  enoxaparin (LOVENOX) injection  40 mg Subcutaneous QHS    morphine injection  2 mg Intravenous Once   naproxen  500 mg Oral BID WC   rosuvastatin  10 mg Oral Q Sun   Continuous Infusions: PRN Meds:.acetaminophen **OR** acetaminophen, oxyCODONE-acetaminophen **OR** morphine injection, ondansetron **OR** ondansetron (ZOFRAN) IV, polyethylene glycol, tiZANidine  Diet Orders (From admission, onward)     Start     Ordered   12/14/20 1148  Diet regular Room service appropriate? Yes; Fluid consistency: Thin  Diet effective now       Question Answer Comment  Room service appropriate? Yes   Fluid consistency: Thin      12/14/20 1147            DVT prophylaxis: enoxaparin (LOVENOX) injection 40 mg Start: 12/12/20 2230  Code Status: Full Code  Family Communication: no family at bedside   Status is: Inpatient  Remains inpatient appropriate because: intractable pain  Level of care: Med-Surg  Consultants:  Neurosurgery  IR  Procedures:  none  Microbiology  none  Antimicrobials: none     Objective: Vitals:   12/18/20 1643 12/18/20 1949 12/19/20 0508 12/19/20 0733  BP: (!) 165/80 (!) 156/87 (!) 170/85 (!) 146/79  Pulse: 92 86 99 85  Resp: 19 17 18 18   Temp: 97.6 F (36.4 C) 97.8 F (36.6 C) 98 F (36.7 C) 97.8 F (36.6 C)  TempSrc: Oral  Oral Oral  SpO2: 99% 100% 100% 98%  Weight:      Height:        Intake/Output Summary (Last 24 hours) at 12/19/2020 1010 Last data filed at 12/19/2020 1004 Gross per 24 hour  Intake 360 ml  Output 1400 ml  Net -1040 ml    Filed Weights   12/13/20 0425  Weight: 53 kg    Examination:  Constitutional: NAD Eyes: lids and conjunctivae normal, no scleral icterus ENMT: mmm Neck: normal, supple Respiratory: clear to auscultation bilaterally, no wheezing, no crackles. Normal respiratory effort.  Cardiovascular: Regular rate and rhythm, no murmurs / rubs / gallops. No LE edema. Abdomen: soft, no distention, no tenderness. Bowel sounds positive.  Skin: no rashes Neurologic: no focal deficits, equal strength  Data Reviewed: I have independently reviewed following labs and imaging studies   CBC: Recent Labs  Lab 12/12/20 1646 12/13/20 0306 12/14/20 0133 12/15/20 0101  WBC 9.5 6.9 11.0* 12.4*  NEUTROABS 6.8 4.4  --   --   HGB 15.5* 14.5 15.2* 16.4*  HCT 45.5 43.8 43.9 47.1*  MCV 92.7 94.8 91.3 91.6  PLT 474* 428* 412* 500*    Basic Metabolic Panel: Recent Labs  Lab 12/12/20 1646 12/13/20 0306 12/14/20 0133 12/15/20 0101  NA 139 137 137 138  K 3.3* 3.4* 4.4 4.3  CL 104 102 105 102  CO2 22 25 21* 24  GLUCOSE 104* 108* 108* 107*  BUN 8 9 8 10   CREATININE 0.84 0.94 0.92 0.91  CALCIUM 9.3 8.9 9.0 9.3  MG  --  2.3  --   --     Liver Function Tests: Recent Labs  Lab 12/12/20 1646 12/13/20 0306 12/14/20 0133  AST 29 23 24   ALT 23 21 19   ALKPHOS 86 83 83  BILITOT 0.9 0.5 0.7  PROT 6.7 6.2* 6.1*  ALBUMIN 3.7 3.2* 3.3*    Coagulation Profile: No results for input(s): INR, PROTIME in the last 168  hours. HbA1C: No results for input(s): HGBA1C in the last 72 hours. CBG: No results for input(s): GLUCAP in the last 168 hours.  Recent Results (from the past 240 hour(s))  Resp Panel by RT-PCR (Flu A&B, Covid) Nasopharyngeal Swab     Status: None   Collection Time: 12/13/20  3:55 AM   Specimen: Nasopharyngeal Swab; Nasopharyngeal(NP) swabs in vial transport medium  Result Value Ref Range Status   SARS Coronavirus 2 by RT PCR NEGATIVE NEGATIVE Final    Comment: (NOTE) SARS-CoV-2 target nucleic acids are NOT DETECTED.  The SARS-CoV-2 RNA is generally detectable in upper respiratory specimens during the acute phase of infection. The lowest concentration of SARS-CoV-2 viral copies this assay can detect is 138 copies/mL. A negative result does not preclude SARS-Cov-2 infection and should not be used as the sole basis for treatment or other patient management decisions. A negative result may occur with  improper specimen collection/handling, submission of specimen other than nasopharyngeal swab, presence of viral mutation(s) within the areas targeted by this assay, and inadequate number of viral copies(<138 copies/mL). A negative result must be combined with clinical observations, patient history, and epidemiological information. The expected result is Negative.  Fact Sheet for Patients:  EntrepreneurPulse.com.au  Fact Sheet for Healthcare Providers:  IncredibleEmployment.be  This test is no t yet approved or cleared by the [place] FDA and  has been authorized for detection and/or diagnosis of SARS-CoV-2 by FDA under an Emergency Use Authorization (EUA). This EUA will remain  in effect (meaning this test can be used) for the duration of the COVID-19 declaration under Section 564(b)(1) of the Act, 21 U.S.C.section 360bbb-3(b)(1), unless the authorization is terminated  or revoked sooner.       Influenza A by PCR NEGATIVE NEGATIVE Final    Influenza B by PCR NEGATIVE NEGATIVE Final    Comment: (NOTE) The Xpert Xpress SARS-CoV-2/FLU/RSV plus assay is intended as an aid in the diagnosis of influenza from Nasopharyngeal swab specimens and should not be used as a sole basis for treatment. Nasal washings and aspirates are unacceptable for Xpert Xpress SARS-CoV-2/FLU/RSV testing.  Fact Sheet for Patients: EntrepreneurPulse.com.au  Fact Sheet for Healthcare Providers: IncredibleEmployment.be  This test is not yet approved or cleared by the [place] FDA and has been authorized for detection and/or diagnosis of SARS-CoV-2 by FDA under an Emergency Use Authorization (EUA). This EUA will remain in effect (meaning this test can be used) for the duration of the COVID-19 declaration under Section 564(b)(1) of the Act, 21 U.S.C. section 360bbb-3(b)(1), unless the authorization is terminated or revoked.  Performed at [facility], [place] [address] [place], [place] [address]   Urine Culture     Status: Abnormal   Collection Time: 12/13/20  3:59 AM   Specimen: Urine, Clean Catch  Result Value Ref Range Status   Specimen Description URINE, CLEAN CATCH  Final   Special Requests   Final    NONE Performed at [facility], [place] [address], [place],  [address]    Culture (A)  Final    >=100,000 COLONIES/mL KLEBSIELLA PNEUMONIAE 30,000 COLONIES/mL ENTEROCOCCUS FAECALIS    Report Status 12/16/2020 FINAL  Final   Organism ID, Bacteria KLEBSIELLA PNEUMONIAE (A)  Final   Organism ID, Bacteria ENTEROCOCCUS FAECALIS (A)  Final      Susceptibility   Enterococcus faecalis - MIC*    AMPICILLIN <=2 SENSITIVE Sensitive     NITROFURANTOIN <=16 SENSITIVE Sensitive     VANCOMYCIN 1 SENSITIVE Sensitive     * 30,000 COLONIES/mL ENTEROCOCCUS FAECALIS   Klebsiella pneumoniae - MIC*    AMPICILLIN >=32 RESISTANT Resistant     CEFAZOLIN <=4 SENSITIVE Sensitive     CEFEPIME <=0.12 SENSITIVE  Sensitive     CEFTRIAXONE <=0.25 SENSITIVE Sensitive     CIPROFLOXACIN <=0.25 SENSITIVE Sensitive     GENTAMICIN <=1 SENSITIVE Sensitive     IMIPENEM <=0.25 SENSITIVE Sensitive     NITROFURANTOIN 128 RESISTANT Resistant     TRIMETH/SULFA <=20 SENSITIVE Sensitive     AMPICILLIN/SULBACTAM 4 SENSITIVE Sensitive     PIP/TAZO <=4 SENSITIVE Sensitive     * >=100,000 COLONIES/mL KLEBSIELLA PNEUMONIAE      Radiology Studies: No results found.   Marzetta Board, MD, PhD [facility]  Between 7 am - 7 pm I am available, please contact me via Amion (for emergencies) or Securechat (non urgent messages)  Between 7 pm -  7 am I am not available, please contact night coverage MD/APP via Amion

## 2020-12-19 NOTE — TOC Progression Note (Signed)
Transition of Care [facility]) - Progression Note    Patient Details  Name: Carmen Rose MRN: [id number] Date of Birth: [birth year]  [facility] CM/SW Contact  Emeterio Reeve, [place] Phone Number: 12/19/2020, 4:25 PM  Clinical Narrative:     MD still waiting to hear decision from insurance appeal for surgery. CSW met with pt and two brothers at bedside. CSW explained previous conversation with pt to brothers. Pts brothers feel that SNF is the best choice. CSW stated she will reach out to facilities when final decision is made about SNF. CSW also asked pt and brothers to think about a plan B. Pt walked 180 ft with pt and is a min guard/ min assist pt also has a click score of 17.   Expected Discharge Plan: [place] Barriers to Discharge: Continued Medical Work up, Ship broker  Expected Discharge Plan and Services Expected Discharge Plan: [place] arrangements for the past 2 months: Single Family Home                                       Social Determinants of Health (SDOH) Interventions    Readmission Risk Interventions No flowsheet data found.  Emeterio Reeve, LCSW Clinical Social Worker

## 2020-12-19 NOTE — Progress Notes (Signed)
Physical Therapy Treatment Patient Details Name: Carmen Rose MRN: [id number] DOB: Sep 21, 1940 Today's Date: 12/19/2020   History of Present Illness 80 y/o female admitted 12/6 secondary to increased pain on the L buttock. Recent non displaced R pelvic fx. Now has sacral ala fracture and noted grade 1 anterolisthesis at L4-L5 with moderate foraminal stenosis.  PMH includes anemia.    PT Comments    Pt progressing steadily toward goals, limited by mild instability and incoordination with gait and a tendency to lose focus on task.  Emphasis on sit to stands, standing stability, progression of gait with stress on quality of gait and proximity to the RW/posture.    Recommendations for follow up therapy are one component of a multi-disciplinary discharge planning process, led by the attending physician.  Recommendations may be updated based on patient status, additional functional criteria and insurance authorization.  Follow Up Recommendations  Skilled nursing-short term rehab (<3 hours/day)     Assistance Recommended at Discharge Frequent or constant Supervision/Assistance  Equipment Recommendations  None recommended by PT    Recommendations for Other Services       Precautions / Restrictions Precautions Precautions: Fall Restrictions RLE Weight Bearing: Weight bearing as tolerated     Mobility  Bed Mobility Overal bed mobility: Modified Independent                  Transfers Overall transfer level: Needs assistance Equipment used: Rolling walker (2 wheels) Transfers: Sit to/from Stand Sit to Stand: Supervision;Min guard           General transfer comment: pt variable in her steadiness. dependent on fatigue    Ambulation/Gait Ambulation/Gait assistance: Min guard;Min assist Gait Distance (Feet): 180 Feet Assistive device: Rolling walker (2 wheels) Gait Pattern/deviations: Step-to pattern;Decreased step length - right;Decreased step length -  left;Decreased stride length   Gait velocity interpretation: <1.8 ft/sec, indicate of risk for recurrent falls   General Gait Details: pt with mild unsteadiness overall, step to pattern with mild incoordination of R LE.   Stairs             Wheelchair Mobility    Modified Rankin (Stroke Patients Only)       Balance Overall balance assessment: Needs assistance   Sitting balance-Leahy Scale: Good     Standing balance support: Bilateral upper extremity supported;During functional activity Standing balance-Leahy Scale: Poor Standing balance comment: REliant on BUE support                            Cognition Arousal/Alertness: Awake/alert Behavior During Therapy: WFL for tasks assessed/performed Overall Cognitive Status: Within Functional Limits for tasks assessed                                          Exercises      General Comments        Pertinent Vitals/Pain Pain Assessment: Faces Faces Pain Scale: Hurts a little bit Pain Location: B hips Pain Descriptors / Indicators: Guarding Pain Intervention(s): Monitored during session    Home Living                          Prior Function            PT Goals (current goals can now be found in the care plan section) Acute Rehab PT  Goals Patient Stated Goal: to get stronger and go home PT Goal Formulation: With patient Time For Goal Achievement: 12/27/20 Potential to Achieve Goals: Good Progress towards PT goals: Progressing toward goals    Frequency    Min 2X/week      PT Plan Current plan remains appropriate    Co-evaluation              AM-PAC PT "6 Clicks" Mobility   Outcome Measure  Help needed turning from your back to your side while in a flat bed without using bedrails?: None Help needed moving from lying on your back to sitting on the side of a flat bed without using bedrails?: None Help needed moving to and from a bed to a chair (including  a wheelchair)?: A Little Help needed standing up from a chair using your arms (e.g., wheelchair or bedside chair)?: A Little Help needed to walk in hospital room?: A Lot Help needed climbing 3-5 steps with a railing? : Total 6 Click Score: 17    End of Session Equipment Utilized During Treatment: Gait belt Activity Tolerance: Patient limited by pain;Patient limited by fatigue;Patient tolerated treatment well Patient left: in bed;with call bell/phone within reach Nurse Communication: Mobility status PT Visit Diagnosis: Other abnormalities of gait and mobility (R26.89);History of falling (Z91.81);Pain Pain - Right/Left: Right Pain - part of body: Hip     Time: 1435-1501 PT Time Calculation (min) (ACUTE ONLY): 26 min  Charges:  $Gait Training: 8-22 mins $Therapeutic Activity: 8-22 mins                     12/19/2020  Carmen Rose., PT Acute Rehabilitation Services [phone]  (pager) [phone]  (office)   Carmen Rose 12/19/2020, 3:16 PM

## 2020-12-19 NOTE — Progress Notes (Signed)
PT Cancellation Note  Patient Details Name: Carmen Rose MRN: [id number] DOB: 1940-07-14   Cancelled Treatment:    Reason Eval/Treat Not Completed: Patient declined, no reason specified.  Pt refused stating her feet are hurting. 12/19/2020  Ginger Carne., PT Acute Rehabilitation Services [phone]  (pager) [phone]  (office)   [name] 12/19/2020, 2:09 PM

## 2020-12-20 LAB — BASIC METABOLIC PANEL
Anion gap: 9 (ref 5–15)
BUN: 8 mg/dL (ref 8–23)
CO2: 24 mmol/L (ref 22–32)
Calcium: 9.1 mg/dL (ref 8.9–10.3)
Chloride: 105 mmol/L (ref 98–111)
Creatinine, Ser: 1.07 mg/dL — ABNORMAL HIGH (ref 0.44–1.00)
GFR, Estimated: 53 mL/min — ABNORMAL LOW (ref >60)
Glucose, Bld: 102 mg/dL — ABNORMAL HIGH (ref 70–99)
Potassium: 4.4 mmol/L (ref 3.5–5.1)
Sodium: 138 mmol/L (ref 135–145)

## 2020-12-20 LAB — CBC
HCT: 44.5 % (ref 36.0–46.0)
Hemoglobin: 15.4 g/dL — ABNORMAL HIGH (ref 12.0–15.0)
MCH: 31.6 pg (ref 26.0–34.0)
MCHC: 34.6 g/dL (ref 30.0–36.0)
MCV: 91.2 fL (ref 80.0–100.0)
Platelets: 520 10*3/uL — ABNORMAL HIGH (ref 150–400)
RBC: 4.88 MIL/uL (ref 3.87–5.11)
RDW: 13.3 % (ref 11.5–15.5)
WBC: 7.9 10*3/uL (ref 4.0–10.5)
nRBC: 0 % (ref 0.0–0.2)

## 2020-12-20 MED ORDER — POLYETHYLENE GLYCOL 3350 17 G PO PACK
17.0000 g | PACK | Freq: Every day | ORAL | Status: DC
  Administered 2020-12-20: 12:00:00 17 g via ORAL
  Filled 2020-12-20: qty 1

## 2020-12-20 NOTE — Progress Notes (Signed)
Mobility Specialist Progress Note:   12/20/20 1010  Mobility  Activity Ambulated in hall  Level of Assistance Contact guard assist, steadying assist  Assistive Device Front wheel walker  RLE Weight Bearing WBAT  Distance Ambulated (ft) 180 ft  Mobility Ambulated with assistance in hallway  Mobility Response Tolerated well  Mobility performed by Mobility specialist  $Mobility charge 1 Mobility   Pt with no buttock pain this morning. Pt asx during ambulation, only limited d/t BUE fatigue. Pt back in bed with all needs met.   Nelta Numbers Mobility Specialist  Phone [phone]

## 2020-12-20 NOTE — Progress Notes (Signed)
PROGRESS NOTE    Carmen Rose  YCX:[id number] DOB: Dec 16, 1940 DOA: 12/12/2020 PCP: Ginger Organ., MD   Brief Narrative: 80 year old with past medical history significant for osteoporosis, hyperlipidemia, mitral valve prolapse with recent diagnosis of nondisplaced fracture of the right superior pubic ramus 11/25 after a fall who presents to [facility] with complaints of severe pelvic, left buttock and left leg pain as well as urinary incontinence.  She was admitted to [facility] on 11/25 until 11/26 after mechanical fall and not displaced pelvic pubic ramus fracture.  Ortho also patient and did not recommended any surgical intervention.  The day of admission she was sitting down in her sofa when she felt like she dropped a bit more than she would have.  She experience worsening pain, unable to walk and came back to the hospital.  CT imaging of the pelvis was performed revealing an unchanged minimally displaced bilateral zone 1 sacral alla fracture and unchanged alignment of the mildly displaced right anterior superior pubic fracture.  MRI of the lumbar spine was additionally performed that revealed grade 1 anterolisthesis at L4-L5 with moderate foraminal stenosis.  IR consulted and sacroplasty was planning but insurance denied, currently awaiting appeal process.  Sent message to IR PA to get updates.   Assessment & Plan:   Principal Problem:   Left buttock pain Active Problems:   Closed fracture of superior pubic ramus, right, initial encounter (HCC)   Anterolisthesis of lumbar spine   Urinary incontinence   Mixed hyperlipidemia   Closed zone 1 fracture of sacrum (HCC)   Closed zone 3 fracture of sacrum (HCC)   1-Pelvic pain, fracture: Mildly displaced right anterior acetabular/superior pubic root fracture and mildly displaced right inferior pubic ramus fracture: -Patient appears to fail conservative management and have to be hospitalized again. -IR considering sacroplasty,  pending insurance authorization.  Awaiting IR updates in regards to insurance. -Continue with PT OT, will need SNF  2-Urinary incontinence due to UTI: Completed course of Keflex. Denies dysuria.  3-Mixed Hyperlipidemia:  -Continue with statin.   4-Constipation; start Miralax.     Estimated body mass index is 18.3 kg/m as calculated from the following:   Height as of this encounter: 5\' 7"  (1.702 m).   Weight as of this encounter: 53 kg.   DVT prophylaxis: Lovenox Code Status: Full code Family Communication: Care discussed with patient Disposition Plan:  Status is: Inpatient  Remains inpatient appropriate because: Pain management require IV pain medication.  Awaiting procedure        Consultants:  Neurosurgery IR  Procedures:    Antimicrobials:    Subjective: She denies dysuria.  She would like to be able to go to the bathroom with assistance.  She does not want to depend on urine device.  She was able to work today with PT She denies dysuria  Objective: Vitals:   12/19/20 0733 12/19/20 2137 12/20/20 0449 12/20/20 0752  BP: (!) 146/79 (!) 159/83 (!) 158/93 135/71  Pulse: 85 84 82 82  Resp: 18 17 16 17   Temp: 97.8 F (36.6 C) 98.2 F (36.8 C) (!) 97.4 F (36.3 C) 97.8 F (36.6 C)  TempSrc: Oral Oral Oral Oral  SpO2: 98% 95% 100% 100%  Weight:      Height:        Intake/Output Summary (Last 24 hours) at 12/20/2020 1140 Last data filed at 12/19/2020 2154 Gross per 24 hour  Intake 480 ml  Output 850 ml  Net -370 ml  Filed Weights   12/13/20 0425  Weight: 53 kg    Examination:  General exam: Appears calm and comfortable  Respiratory system: Clear to auscultation. Respiratory effort normal. Cardiovascular system: S1 & S2 heard, RRR. No JVD, murmurs, rubs, gallops or clicks. No pedal edema. Gastrointestinal system: Abdomen is nondistended, soft and nontender. No organomegaly or masses felt. Normal bowel sounds heard. Central nervous system:  Alert and oriented.  Extremities: Symmetric 5 x 5 power.   Data Reviewed: I have personally reviewed following labs and imaging studies  CBC: Recent Labs  Lab 12/14/20 0133 12/15/20 0101 12/20/20 0518  WBC 11.0* 12.4* 7.9  HGB 15.2* 16.4* 15.4*  HCT 43.9 47.1* 44.5  MCV 91.3 91.6 91.2  PLT 412* 500* 546*   Basic Metabolic Panel: Recent Labs  Lab 12/14/20 0133 12/15/20 0101 12/20/20 0518  NA 137 138 138  K 4.4 4.3 4.4  CL 105 102 105  CO2 21* 24 24  GLUCOSE 108* 107* 102*  BUN 8 10 8   CREATININE 0.92 0.91 1.07*  CALCIUM 9.0 9.3 9.1   GFR: Estimated Creatinine Clearance: 35.1 mL/min (A) (by C-G formula based on SCr of 1.07 mg/dL (H)). Liver Function Tests: Recent Labs  Lab 12/14/20 0133  AST 24  ALT 19  ALKPHOS 83  BILITOT 0.7  PROT 6.1*  ALBUMIN 3.3*   No results for input(s): LIPASE, AMYLASE in the last 168 hours. No results for input(s): AMMONIA in the last 168 hours. Coagulation Profile: No results for input(s): INR, PROTIME in the last 168 hours. Cardiac Enzymes: No results for input(s): CKTOTAL, CKMB, CKMBINDEX, TROPONINI in the last 168 hours. BNP (last 3 results) No results for input(s): PROBNP in the last 8760 hours. HbA1C: No results for input(s): HGBA1C in the last 72 hours. CBG: No results for input(s): GLUCAP in the last 168 hours. Lipid Profile: No results for input(s): CHOL, HDL, LDLCALC, TRIG, CHOLHDL, LDLDIRECT in the last 72 hours. Thyroid Function Tests: No results for input(s): TSH, T4TOTAL, FREET4, T3FREE, THYROIDAB in the last 72 hours. Anemia Panel: No results for input(s): VITAMINB12, FOLATE, FERRITIN, TIBC, IRON, RETICCTPCT in the last 72 hours. Sepsis Labs: No results for input(s): PROCALCITON, LATICACIDVEN in the last 168 hours.  Recent Results (from the past 240 hour(s))  Resp Panel by RT-PCR (Flu A&B, Covid) Nasopharyngeal Swab     Status: None   Collection Time: 12/13/20  3:55 AM   Specimen: Nasopharyngeal Swab;  Nasopharyngeal(NP) swabs in vial transport medium  Result Value Ref Range Status   SARS Coronavirus 2 by RT PCR NEGATIVE NEGATIVE Final    Comment: (NOTE) SARS-CoV-2 target nucleic acids are NOT DETECTED.  The SARS-CoV-2 RNA is generally detectable in upper respiratory specimens during the acute phase of infection. The lowest concentration of SARS-CoV-2 viral copies this assay can detect is 138 copies/mL. A negative result does not preclude SARS-Cov-2 infection and should not be used as the sole basis for treatment or other patient management decisions. A negative result may occur with  improper specimen collection/handling, submission of specimen other than nasopharyngeal swab, presence of viral mutation(s) within the areas targeted by this assay, and inadequate number of viral copies(<138 copies/mL). A negative result must be combined with clinical observations, patient history, and epidemiological information. The expected result is Negative.  Fact Sheet for Patients:  EntrepreneurPulse.com.au  Fact Sheet for Healthcare Providers:  IncredibleEmployment.be  This test is no t yet approved or cleared by the [place] FDA and  has been authorized for detection and/or  diagnosis of SARS-CoV-2 by FDA under an Emergency Use Authorization (EUA). This EUA will remain  in effect (meaning this test can be used) for the duration of the COVID-19 declaration under Section 564(b)(1) of the Act, 21 U.S.C.section 360bbb-3(b)(1), unless the authorization is terminated  or revoked sooner.       Influenza A by PCR NEGATIVE NEGATIVE Final   Influenza B by PCR NEGATIVE NEGATIVE Final    Comment: (NOTE) The Xpert Xpress SARS-CoV-2/FLU/RSV plus assay is intended as an aid in the diagnosis of influenza from Nasopharyngeal swab specimens and should not be used as a sole basis for treatment. Nasal washings and aspirates are unacceptable for Xpert Xpress  SARS-CoV-2/FLU/RSV testing.  Fact Sheet for Patients: EntrepreneurPulse.com.au  Fact Sheet for Healthcare Providers: IncredibleEmployment.be  This test is not yet approved or cleared by the [place] FDA and has been authorized for detection and/or diagnosis of SARS-CoV-2 by FDA under an Emergency Use Authorization (EUA). This EUA will remain in effect (meaning this test can be used) for the duration of the COVID-19 declaration under Section 564(b)(1) of the Act, 21 U.S.C. section 360bbb-3(b)(1), unless the authorization is terminated or revoked.  Performed at [facility], [place] [address] [place], [place] [address]   Urine Culture     Status: Abnormal   Collection Time: 12/13/20  3:59 AM   Specimen: Urine, Clean Catch  Result Value Ref Range Status   Specimen Description URINE, CLEAN CATCH  Final   Special Requests   Final    NONE Performed at [facility], [place] [address], [place], [place] [address]    Culture (A)  Final    >=100,000 COLONIES/mL KLEBSIELLA PNEUMONIAE 30,000 COLONIES/mL ENTEROCOCCUS FAECALIS    Report Status 12/16/2020 FINAL  Final   Organism ID, Bacteria KLEBSIELLA PNEUMONIAE (A)  Final   Organism ID, Bacteria ENTEROCOCCUS FAECALIS (A)  Final      Susceptibility   Enterococcus faecalis - MIC*    AMPICILLIN <=2 SENSITIVE Sensitive     NITROFURANTOIN <=16 SENSITIVE Sensitive     VANCOMYCIN 1 SENSITIVE Sensitive     * 30,000 COLONIES/mL ENTEROCOCCUS FAECALIS   Klebsiella pneumoniae - MIC*    AMPICILLIN >=32 RESISTANT Resistant     CEFAZOLIN <=4 SENSITIVE Sensitive     CEFEPIME <=0.12 SENSITIVE Sensitive     CEFTRIAXONE <=0.25 SENSITIVE Sensitive     CIPROFLOXACIN <=0.25 SENSITIVE Sensitive     GENTAMICIN <=1 SENSITIVE Sensitive     IMIPENEM <=0.25 SENSITIVE Sensitive     NITROFURANTOIN 128 RESISTANT Resistant     TRIMETH/SULFA <=20 SENSITIVE Sensitive     AMPICILLIN/SULBACTAM 4 SENSITIVE Sensitive      PIP/TAZO <=4 SENSITIVE Sensitive     * >=100,000 COLONIES/mL KLEBSIELLA PNEUMONIAE         Radiology Studies: No results found.      Scheduled Meds:  enoxaparin (LOVENOX) injection  40 mg Subcutaneous QHS    morphine injection  2 mg Intravenous Once   polyethylene glycol  17 g Oral Daily   rosuvastatin  10 mg Oral Q Sun   Continuous Infusions:   LOS: 7 days    Time spent: 35 minutes    Dravyn Severs A Larenzo Caples, MD [facility]   If 7PM-7AM, please contact night-coverage [email or web address]  12/20/2020, 11:40 AM

## 2020-12-20 NOTE — Progress Notes (Signed)
Mobility Specialist Progress Note:   12/20/20 1430  Mobility  Activity Ambulated to bathroom  Level of Assistance Contact guard assist, steadying assist  [place] wheel walker  RLE Weight Bearing WBAT  Distance Ambulated (ft) 20 ft  Mobility Out of bed for toileting  Mobility Response Tolerated fair  Mobility performed by Mobility specialist  $Mobility charge 1 Mobility   Pt eager for hallway ambulation, however to BR at beginning of session. BM successful, required assistance for pericare, which fatigued pt. Pt refused further mobility d/t buttock pain. Back in bed with all needs met.   Nelta Numbers Mobility Specialist  Phone [phone]

## 2020-12-20 NOTE — Progress Notes (Signed)
Interventional Radiology received a request to evaluate patient for possible sacroplasty and this procedure was approved by Dr. Karenann Cai (please see IR consult note from 12/13/20). Insurance ([place]) denied the procedure and IR worked to appeal this without success.   Dr. Karenann Cai contacted [employer] (the company that supplies the necessary items for this procedure) and they have a team that can assist with appeals/denials. This is currently in progress.   I met with the patient at the bedside today to provide an update. She was already aware of the insurance denial. She states she is still having pain/mobility issues but she is trying to work with PT as much as possible.   IR will continue to follow this patient and will provide an update on the insurance appeal when one becomes available.   Carmen Rose, [place] [phone] 12/20/2020, 4:38 PM

## 2020-12-21 LAB — BASIC METABOLIC PANEL
Anion gap: 11 (ref 5–15)
BUN: 7 mg/dL — ABNORMAL LOW (ref 8–23)
CO2: 23 mmol/L (ref 22–32)
Calcium: 9.3 mg/dL (ref 8.9–10.3)
Chloride: 105 mmol/L (ref 98–111)
Creatinine, Ser: 0.99 mg/dL (ref 0.44–1.00)
GFR, Estimated: 58 mL/min — ABNORMAL LOW (ref >60)
Glucose, Bld: 115 mg/dL — ABNORMAL HIGH (ref 70–99)
Potassium: 3.6 mmol/L (ref 3.5–5.1)
Sodium: 139 mmol/L (ref 135–145)

## 2020-12-21 MED ORDER — POLYETHYLENE GLYCOL 3350 17 G PO PACK: 17.0000 g | PACK | Freq: Every day | ORAL | Status: DC | PRN

## 2020-12-21 NOTE — Progress Notes (Signed)
Mobility Specialist Progress Note:   12/21/20 0950  Mobility  Activity Ambulated in hall  Level of Assistance Contact guard assist, steadying assist  Assistive Device Front wheel walker  RLE Weight Bearing WBAT  Distance Ambulated (ft) 200 ft  Mobility Out of bed for toileting;Ambulated with assistance in hallway  Mobility Response Tolerated well  Mobility performed by Mobility specialist  $Mobility charge 1 Mobility   Pt with minor pain during ambulation this am. Otherwise asx. Pt back in bed with all needs met.   Nelta Numbers Mobility Specialist  Phone [phone]

## 2020-12-21 NOTE — Progress Notes (Signed)
Mobility Specialist Progress Note   12/21/20 1730  Mobility  Activity Sat and stood x 3 (x10*)  Level of Assistance Contact guard assist, steadying assist  Assistive Device None  RLE Weight Bearing WBAT  Mobility Response Tolerated well  Mobility performed by Mobility specialist  $Mobility charge 1 Mobility   Pt able to maneuver to EOB independently w/ no complaints. Proceeded to practice standing w/ no AD, only minor struggle pertained to fully extending hip when standing but once upright pt able to maintain balance on there own. X10 STS w/ no break then returned back to bed w/ all needs met.    Holland Falling Mobility Specialist Phone Number [phone]

## 2020-12-21 NOTE — Progress Notes (Signed)
PROGRESS NOTE    Carmen Rose  DGU:[id number] DOB: 09-22-1940 DOA: 12/12/2020 PCP: Ginger Organ., MD   Brief Narrative: 80 year old with past medical history significant for osteoporosis, hyperlipidemia, mitral valve prolapse with recent diagnosis of nondisplaced fracture of the right superior pubic ramus 11/25 after a fall who presents to [facility] with complaints of severe pelvic, left buttock and left leg pain as well as urinary incontinence.  She was admitted to [facility] on 11/25 until 11/26 after mechanical fall and not displaced pelvic pubic ramus fracture.  Ortho also patient and did not recommended any surgical intervention.  The day of admission she was sitting down in her sofa when she felt like she dropped a bit more than she would have.  She experience worsening pain, unable to walk and came back to the hospital.  CT imaging of the pelvis was performed revealing an unchanged minimally displaced bilateral zone 1 sacral alla fracture and unchanged alignment of the mildly displaced right anterior superior pubic fracture.  MRI of the lumbar spine was additionally performed that revealed grade 1 anterolisthesis at L4-L5 with moderate foraminal stenosis.  IR consulted and sacroplasty was planning but insurance denied, currently awaiting appeal process.  Awaiting appeal results.    Assessment & Plan:   Principal Problem:   Left buttock pain Active Problems:   Closed fracture of superior pubic ramus, right, initial encounter (HCC)   Anterolisthesis of lumbar spine   Urinary incontinence   Mixed hyperlipidemia   Closed zone 1 fracture of sacrum (HCC)   Closed zone 3 fracture of sacrum (HCC)   1-Pelvic pain, fracture: Mildly displaced right anterior acetabular/superior pubic root fracture and mildly displaced right inferior pubic ramus fracture: -Patient appears to fail conservative management and have to be hospitalized again. -IR considering sacroplasty, pending  insurance authorization.  -Awaiting results form appeal  -Continue with PT OT, will need SNF  2-Urinary incontinence due to UTI: Completed course of Keflex. Denies dysuria.  3-Mixed Hyperlipidemia:  -Continue with statin.   4-Constipation; had multiples BM with a dose of Miralax. Change Miralax to PRN    Estimated body mass index is 18.3 kg/m as calculated from the following:   Height as of this encounter: 5\' 7"  (1.702 m).   Weight as of this encounter: 53 kg.   DVT prophylaxis: Lovenox Code Status: Full code Family Communication: Care discussed with patient Disposition Plan:  Status is: Inpatient  Remains inpatient appropriate because: Pain management require IV pain medication.  Awaiting procedure        Consultants:  Neurosurgery IR  Procedures:    Antimicrobials:    Subjective: Report her feet pain has improved, had Multiples BM yesterday. Didn't want to take miralax today.   Objective: Vitals:   12/20/20 0752 12/20/20 1619 12/20/20 2040 12/21/20 0840  BP: 135/71 (!) 150/80 (!) 141/77 133/67  Pulse: 82 84 86 86  Resp: 17 16 18 18   Temp: 97.8 F (36.6 C) 97.8 F (36.6 C) 97.6 F (36.4 C) 97.7 F (36.5 C)  TempSrc: Oral  Oral Axillary  SpO2: 100% 99% 99% 96%  Weight:      Height:        Intake/Output Summary (Last 24 hours) at 12/21/2020 1222 Last data filed at 12/21/2020 0840 Gross per 24 hour  Intake 960 ml  Output 300 ml  Net 660 ml    Filed Weights   12/13/20 0425  Weight: 53 kg    Examination:  General exam: NAD Respiratory system:  CTA Cardiovascular system: S 1, S 2 RRR Gastrointestinal system: BS present, soft, nt Central nervous system: Alert and oriented.  Extremities: no edema   Data Reviewed: I have personally reviewed following labs and imaging studies  CBC: Recent Labs  Lab 12/15/20 0101 12/20/20 0518  WBC 12.4* 7.9  HGB 16.4* 15.4*  HCT 47.1* 44.5  MCV 91.6 91.2  PLT 500* 520*    Basic Metabolic  Panel: Recent Labs  Lab 12/15/20 0101 12/20/20 0518 12/21/20 0824  NA 138 138 139  K 4.3 4.4 3.6  CL 102 105 105  CO2 24 24 23   GLUCOSE 107* 102* 115*  BUN 10 8 7*  CREATININE 0.91 1.07* 0.99  CALCIUM 9.3 9.1 9.3    GFR: Estimated Creatinine Clearance: 37.9 mL/min (by C-G formula based on SCr of 0.99 mg/dL). Liver Function Tests: No results for input(s): AST, ALT, ALKPHOS, BILITOT, PROT, ALBUMIN in the last 168 hours.  No results for input(s): LIPASE, AMYLASE in the last 168 hours. No results for input(s): AMMONIA in the last 168 hours. Coagulation Profile: No results for input(s): INR, PROTIME in the last 168 hours. Cardiac Enzymes: No results for input(s): CKTOTAL, CKMB, CKMBINDEX, TROPONINI in the last 168 hours. BNP (last 3 results) No results for input(s): PROBNP in the last 8760 hours. HbA1C: No results for input(s): HGBA1C in the last 72 hours. CBG: No results for input(s): GLUCAP in the last 168 hours. Lipid Profile: No results for input(s): CHOL, HDL, LDLCALC, TRIG, CHOLHDL, LDLDIRECT in the last 72 hours. Thyroid Function Tests: No results for input(s): TSH, T4TOTAL, FREET4, T3FREE, THYROIDAB in the last 72 hours. Anemia Panel: No results for input(s): VITAMINB12, FOLATE, FERRITIN, TIBC, IRON, RETICCTPCT in the last 72 hours. Sepsis Labs: No results for input(s): PROCALCITON, LATICACIDVEN in the last 168 hours.  Recent Results (from the past 240 hour(s))  Resp Panel by RT-PCR (Flu A&B, Covid) Nasopharyngeal Swab     Status: None   Collection Time: 12/13/20  3:55 AM   Specimen: Nasopharyngeal Swab; Nasopharyngeal(NP) swabs in vial transport medium  Result Value Ref Range Status   SARS Coronavirus 2 by RT PCR NEGATIVE NEGATIVE Final    Comment: (NOTE) SARS-CoV-2 target nucleic acids are NOT DETECTED.  The SARS-CoV-2 RNA is generally detectable in upper respiratory specimens during the acute phase of infection. The lowest concentration of SARS-CoV-2 viral  copies this assay can detect is 138 copies/mL. A negative result does not preclude SARS-Cov-2 infection and should not be used as the sole basis for treatment or other patient management decisions. A negative result may occur with  improper specimen collection/handling, submission of specimen other than nasopharyngeal swab, presence of viral mutation(s) within the areas targeted by this assay, and inadequate number of viral copies(<138 copies/mL). A negative result must be combined with clinical observations, patient history, and epidemiological information. The expected result is Negative.  Fact Sheet for Patients:  EntrepreneurPulse.com.au  Fact Sheet for Healthcare Providers:  IncredibleEmployment.be  This test is no t yet approved or cleared by the [place] FDA and  has been authorized for detection and/or diagnosis of SARS-CoV-2 by FDA under an Emergency Use Authorization (EUA). This EUA will remain  in effect (meaning this test can be used) for the duration of the COVID-19 declaration under Section 564(b)(1) of the Act, 21 U.S.C.section 360bbb-3(b)(1), unless the authorization is terminated  or revoked sooner.       Influenza A by PCR NEGATIVE NEGATIVE Final   Influenza B by PCR NEGATIVE NEGATIVE  Final    Comment: (NOTE) The Xpert Xpress SARS-CoV-2/FLU/RSV plus assay is intended as an aid in the diagnosis of influenza from Nasopharyngeal swab specimens and should not be used as a sole basis for treatment. Nasal washings and aspirates are unacceptable for Xpert Xpress SARS-CoV-2/FLU/RSV testing.  Fact Sheet for Patients: EntrepreneurPulse.com.au  Fact Sheet for Healthcare Providers: IncredibleEmployment.be  This test is not yet approved or cleared by the [place] FDA and has been authorized for detection and/or diagnosis of SARS-CoV-2 by FDA under an Emergency Use Authorization (EUA). This  EUA will remain in effect (meaning this test can be used) for the duration of the COVID-19 declaration under Section 564(b)(1) of the Act, 21 U.S.C. section 360bbb-3(b)(1), unless the authorization is terminated or revoked.  Performed at [facility], [place] [address], [place], [place] [address]   Urine Culture     Status: Abnormal   Collection Time: 12/13/20  3:59 AM   Specimen: Urine, Clean Catch  Result Value Ref Range Status   Specimen Description URINE, CLEAN CATCH  Final   Special Requests   Final    NONE Performed at [facility], [place] [address] [place], [place] [address]    Culture (A)  Final    >=100,000 COLONIES/mL KLEBSIELLA PNEUMONIAE 30,000 COLONIES/mL ENTEROCOCCUS FAECALIS    Report Status 12/16/2020 FINAL  Final   Organism ID, Bacteria KLEBSIELLA PNEUMONIAE (A)  Final   Organism ID, Bacteria ENTEROCOCCUS FAECALIS (A)  Final      Susceptibility   Enterococcus faecalis - MIC*    AMPICILLIN <=2 SENSITIVE Sensitive     NITROFURANTOIN <=16 SENSITIVE Sensitive     VANCOMYCIN 1 SENSITIVE Sensitive     * 30,000 COLONIES/mL ENTEROCOCCUS FAECALIS   Klebsiella pneumoniae - MIC*    AMPICILLIN >=32 RESISTANT Resistant     CEFAZOLIN <=4 SENSITIVE Sensitive     CEFEPIME <=0.12 SENSITIVE Sensitive     CEFTRIAXONE <=0.25 SENSITIVE Sensitive     CIPROFLOXACIN <=0.25 SENSITIVE Sensitive     GENTAMICIN <=1 SENSITIVE Sensitive     IMIPENEM <=0.25 SENSITIVE Sensitive     NITROFURANTOIN 128 RESISTANT Resistant     TRIMETH/SULFA <=20 SENSITIVE Sensitive     AMPICILLIN/SULBACTAM 4 SENSITIVE Sensitive     PIP/TAZO <=4 SENSITIVE Sensitive     * >=100,000 COLONIES/mL KLEBSIELLA PNEUMONIAE          Radiology Studies: No results found.      Scheduled Meds:  enoxaparin (LOVENOX) injection  40 mg Subcutaneous QHS    morphine injection  2 mg Intravenous Once   rosuvastatin  10 mg Oral Q Sun   Continuous Infusions:   LOS: 8 days    Time spent: 35  minutes    Rudransh Bellanca A Shakeitha Umbaugh, MD [facility]   If 7PM-7AM, please contact night-coverage [email or web address]  12/21/2020, 12:22 PM

## 2020-12-21 NOTE — Progress Notes (Signed)
Carmen Rose 1Y60 wants her telemetry d/c'd "it bothers her" and she has no "cardiac history".  Paged Dr. Jeannette Corpus. Pt insistent.   Dr. Kennon Holter responded. Telemetry discontinued per pt request/refusal.

## 2020-12-21 NOTE — TOC Progression Note (Signed)
Transition of Care [facility]) - Progression Note    Patient Details  Name: Rameen Gohlke MRN: [id number] Date of Birth: Jan 12, 1940  Transition of Care [facility] CM/SW Contact  Emeterio Reeve, [place] Phone Number: 12/21/2020, 11:36 AM  Clinical Narrative:     CSW gave pt and brother bed offers. Pt will waiting to hear back about appeal for surgery.  Expected Discharge Plan: [place] Barriers to Discharge: Continued Medical Work up, Ship broker  Expected Discharge Plan and Services Expected Discharge Plan: [place] arrangements for the past 2 months: Single Family Home                                       Social Determinants of Health (SDOH) Interventions    Readmission Risk Interventions No flowsheet data found.  Emeterio Reeve, LCSW Clinical Social Worker

## 2020-12-21 NOTE — Plan of Care (Signed)
Problem: Education: Goal: Knowledge of General Education information will improve Description: Including pain rating scale, medication(s)/side effects and non-pharmacologic comfort measures Outcome: Progressing   Problem: Health Behavior/Discharge Planning: Goal: Ability to manage health-related needs will improve Outcome: Progressing   Problem: Clinical Measurements: Goal: Ability to maintain clinical measurements within normal limits will improve Outcome: Progressing Goal: Diagnostic test results will improve Outcome: Completed/Met Goal: Respiratory complications will improve Outcome: Progressing Goal: Cardiovascular complication will be avoided Outcome: Progressing   Problem: Activity: Goal: Risk for activity intolerance will decrease Outcome: Progressing   Problem: Nutrition: Goal: Adequate nutrition will be maintained Outcome: Progressing   Problem: Elimination: Goal: Will not experience complications related to bowel motility Outcome: Progressing Goal: Will not experience complications related to urinary retention Outcome: Progressing   Problem: Pain Managment: Goal: General experience of comfort will improve Outcome: Progressing   Problem: Safety: Goal: Ability to remain free from injury will improve Outcome: Progressing   Problem: Skin Integrity: Goal: Risk for impaired skin integrity will decrease Outcome: Progressing

## 2020-12-22 MED ORDER — AMLODIPINE BESYLATE 2.5 MG PO TABS
2.5000 mg | ORAL_TABLET | Freq: Every day | ORAL | Status: DC
  Administered 2020-12-22 – 2020-12-24 (×3): 2.5 mg via ORAL
  Filled 2020-12-22 (×3): qty 1

## 2020-12-22 NOTE — TOC Progression Note (Signed)
Transition of Care [facility]) - Progression Note    Patient Details  Name: Jassmin Kemmerer MRN: [id number] Date of Birth: 27-Nov-1940  Transition of Care [facility]) CM/SW [place], [place] Phone Number: 12/22/2020, 12:50 PM  Clinical Narrative:     CSW met with pt regarding discharging to SNF since sacroplasty is going to appeal and likely won't be approved. If approved, pt would be able to return to hospital for procedure. Pt explains she wants to go to [facility]F. CSW called [facility] liaison who informed they do not have beds and do not anticipate availability even early next week. CSW notified pt and discussed with her brother Iona Beard on speaker phone. Second choice is [facility]. CSW contacted [facility]; no answer, awaiting response.    Expected Discharge Plan: [place] Barriers to Discharge: Continued Medical Work up, Ship broker  Expected Discharge Plan and Services Expected Discharge Plan: [place] arrangements for the past 2 months: Single Family Home                                       Social Determinants of Health (SDOH) Interventions    Readmission Risk Interventions No flowsheet data found.

## 2020-12-22 NOTE — TOC Progression Note (Addendum)
Transition of Care [facility]) - Progression Note    Patient Details  Name: Moni Rothrock MRN: [id number] Date of Birth: 03-17-1940  Transition of Care [facility]) CM/SW [place], [place] Phone Number: 12/22/2020, 2:50 PM  Clinical Narrative:     CSW contacted [facility]; they will not have beds this weekend and are not aware of any discharges earlier in the week yet. CSW notified pt and pt's brother. Both pt and brother prefer [facility] as next choice. CSW reached out to [facility]; they well re-review pt to see if they can offer; bed on Monday at earliest.   CSW also contacted [facility]; they do not know when they will have a bed available next.  CSW contacted [facility]; no response yet.   1625: [facility] confirmed they can accept pt Monday. CSW started [place]. Likely will need to upload more recent therapy notes once they are in.   1658: Auth Approved  for [employer]. [id number] 12/22/2020-12/26/2020    Expected Discharge Plan: [place] Barriers to Discharge: Continued Medical Work up, Ship broker  Expected Discharge Plan and Services Expected Discharge Plan: [place] arrangements for the past 2 months: Single Family Home                                       Social Determinants of Health (SDOH) Interventions    Readmission Risk Interventions No flowsheet data found.

## 2020-12-22 NOTE — Plan of Care (Signed)
°  Problem: Education: Goal: Knowledge of General Education information will improve Description: Including pain rating scale, medication(s)/side effects and non-pharmacologic comfort measures Outcome: Progressing   Problem: Activity: Goal: Risk for activity intolerance will decrease Outcome: Progressing   Problem: Nutrition: Goal: Adequate nutrition will be maintained Outcome: Progressing   Problem: Coping: Goal: Level of anxiety will decrease Outcome: Progressing   Problem: Elimination: Goal: Will not experience complications related to urinary retention Outcome: Progressing   Problem: Elimination: Goal: Will not experience complications related to bowel motility Outcome: Progressing   Problem: Pain Managment: Goal: General experience of comfort will improve Outcome: Progressing   Problem: Safety: Goal: Ability to remain free from injury will improve Outcome: Progressing   Problem: Skin Integrity: Goal: Risk for impaired skin integrity will decrease Outcome: Progressing

## 2020-12-22 NOTE — Care Management (Addendum)
NCM spoke to Soyla Dryer, , NP with IR,  Dr. Karenann Cai   contacted [employer] (the company that supplies the necessary items for this procedure) and they have a team that can assist with appeals/denials. Roselyn Reef will ask  Dr. Karenann Cai if there is any update   1134 Update: There has been no update from [employer] but Dr. Karenann Cai does not believe the procedure will be approved.  Secured chatted attending

## 2020-12-22 NOTE — Progress Notes (Addendum)
PROGRESS NOTE    Carmen Rose  QHU:[id number] DOB: July 28, 1940 DOA: 12/12/2020 PCP: Ginger Organ., MD   Brief Narrative: 80 year old with past medical history significant for osteoporosis, hyperlipidemia, mitral valve prolapse with recent diagnosis of nondisplaced fracture of the right superior pubic ramus 11/25 after a fall who presents to [facility] with complaints of severe pelvic, left buttock and left leg pain as well as urinary incontinence.  She was admitted to [facility] on 11/25 until 11/26 after mechanical fall and not displaced pelvic pubic ramus fracture.  Ortho also patient and did not recommended any surgical intervention.  The day of admission she was sitting down in her sofa when she felt like she dropped a bit more than she would have.  She experience worsening pain, unable to walk and came back to the hospital.  CT imaging of the pelvis was performed revealing an unchanged minimally displaced bilateral zone 1 sacral alla fracture and unchanged alignment of the mildly displaced right anterior superior pubic fracture.  MRI of the lumbar spine was additionally performed that revealed grade 1 anterolisthesis at L4-L5 with moderate foraminal stenosis.  IR consulted and sacroplasty was planning but insurance denied, currently awaiting appeal process.    Assessment & Plan:   Principal Problem:   Left buttock pain Active Problems:   Closed fracture of superior pubic ramus, right, initial encounter (HCC)   Anterolisthesis of lumbar spine   Urinary incontinence   Mixed hyperlipidemia   Closed zone 1 fracture of sacrum (HCC)   Closed zone 3 fracture of sacrum (HCC)   1-Pelvic pain, fracture: Mildly displaced right anterior acetabular/superior pubic root fracture and mildly displaced right inferior pubic ramus fracture: -Patient appears to fail conservative management and have to be hospitalized again. -IR considering sacroplasty, pending insurance authorization.   -Awaiting results form appeal. No updates. Per IR procedure might wont get approved.  -Continue with PT OT, will need SNF - working on SNF for rehab.   2-Urinary incontinence due to UTI: Completed course of Keflex. Denies dysuria.  3-Mixed Hyperlipidemia:  -Continue with statin.   4-Constipation; had multiples BM with a dose of Miralax. Change Miralax to PRN  5-HTN; start Norvasc.   Estimated body mass index is 18.3 kg/m as calculated from the following:   Height as of this encounter: 5\' 7"  (1.702 m).   Weight as of this encounter: 53 kg.   DVT prophylaxis: Lovenox Code Status: Full code Family Communication: Care discussed with patient Disposition Plan:  Status is: Inpatient  Remains inpatient appropriate because: Pain management require IV pain medication.  Awaiting procedure        Consultants:  Neurosurgery IR  Procedures:    Antimicrobials:    Subjective: Report mild dyspnea on ambulation, from deconditioning.  Complaining of buttock pain when she sits in hard surface.   Objective: Vitals:   12/21/20 1418 12/21/20 2117 12/22/20 0357 12/22/20 0743  BP: (!) 145/75 (!) 173/81 (!) 156/93 (!) 154/78  Pulse: 85 82 91 83  Resp: 18   18  Temp: 97.7 F (36.5 C) 97.6 F (36.4 C) (!) 97.4 F (36.3 C) 97.6 F (36.4 C)  TempSrc: Oral Oral Oral Oral  SpO2: 97% 98% 100% 100%  Weight:      Height:        Intake/Output Summary (Last 24 hours) at 12/22/2020 1305 Last data filed at 12/22/2020 1132 Gross per 24 hour  Intake 940 ml  Output --  Net 940 ml    Filed  Weights   12/13/20 0425  Weight: 53 kg    Examination:  General exam: NAD Respiratory system: CTA Cardiovascular system: S 1, S 2 RRR Gastrointestinal system: BS present, soft, nt Central nervous system: Alert Extremities: No edema   Data Reviewed: I have personally reviewed following labs and imaging studies  CBC: Recent Labs  Lab 12/20/20 0518  WBC 7.9  HGB 15.4*  HCT 44.5   MCV 91.2  PLT 520*    Basic Metabolic Panel: Recent Labs  Lab 12/20/20 0518 12/21/20 0824  NA 138 139  K 4.4 3.6  CL 105 105  CO2 24 23  GLUCOSE 102* 115*  BUN 8 7*  CREATININE 1.07* 0.99  CALCIUM 9.1 9.3    GFR: Estimated Creatinine Clearance: 37.9 mL/min (by C-G formula based on SCr of 0.99 mg/dL). Liver Function Tests: No results for input(s): AST, ALT, ALKPHOS, BILITOT, PROT, ALBUMIN in the last 168 hours.  No results for input(s): LIPASE, AMYLASE in the last 168 hours. No results for input(s): AMMONIA in the last 168 hours. Coagulation Profile: No results for input(s): INR, PROTIME in the last 168 hours. Cardiac Enzymes: No results for input(s): CKTOTAL, CKMB, CKMBINDEX, TROPONINI in the last 168 hours. BNP (last 3 results) No results for input(s): PROBNP in the last 8760 hours. HbA1C: No results for input(s): HGBA1C in the last 72 hours. CBG: No results for input(s): GLUCAP in the last 168 hours. Lipid Profile: No results for input(s): CHOL, HDL, LDLCALC, TRIG, CHOLHDL, LDLDIRECT in the last 72 hours. Thyroid Function Tests: No results for input(s): TSH, T4TOTAL, FREET4, T3FREE, THYROIDAB in the last 72 hours. Anemia Panel: No results for input(s): VITAMINB12, FOLATE, FERRITIN, TIBC, IRON, RETICCTPCT in the last 72 hours. Sepsis Labs: No results for input(s): PROCALCITON, LATICACIDVEN in the last 168 hours.  Recent Results (from the past 240 hour(s))  Resp Panel by RT-PCR (Flu A&B, Covid) Nasopharyngeal Swab     Status: None   Collection Time: 12/13/20  3:55 AM   Specimen: Nasopharyngeal Swab; Nasopharyngeal(NP) swabs in vial transport medium  Result Value Ref Range Status   SARS Coronavirus 2 by RT PCR NEGATIVE NEGATIVE Final    Comment: (NOTE) SARS-CoV-2 target nucleic acids are NOT DETECTED.  The SARS-CoV-2 RNA is generally detectable in upper respiratory specimens during the acute phase of infection. The lowest concentration of SARS-CoV-2 viral  copies this assay can detect is 138 copies/mL. A negative result does not preclude SARS-Cov-2 infection and should not be used as the sole basis for treatment or other patient management decisions. A negative result may occur with  improper specimen collection/handling, submission of specimen other than nasopharyngeal swab, presence of viral mutation(s) within the areas targeted by this assay, and inadequate number of viral copies(<138 copies/mL). A negative result must be combined with clinical observations, patient history, and epidemiological information. The expected result is Negative.  Fact Sheet for Patients:  EntrepreneurPulse.com.au  Fact Sheet for Healthcare Providers:  IncredibleEmployment.be  This test is no t yet approved or cleared by the [place] FDA and  has been authorized for detection and/or diagnosis of SARS-CoV-2 by FDA under an Emergency Use Authorization (EUA). This EUA will remain  in effect (meaning this test can be used) for the duration of the COVID-19 declaration under Section 564(b)(1) of the Act, 21 U.S.C.section 360bbb-3(b)(1), unless the authorization is terminated  or revoked sooner.       Influenza A by PCR NEGATIVE NEGATIVE Final   Influenza B by PCR NEGATIVE NEGATIVE Final  Comment: (NOTE) The Xpert Xpress SARS-CoV-2/FLU/RSV plus assay is intended as an aid in the diagnosis of influenza from Nasopharyngeal swab specimens and should not be used as a sole basis for treatment. Nasal washings and aspirates are unacceptable for Xpert Xpress SARS-CoV-2/FLU/RSV testing.  Fact Sheet for Patients: EntrepreneurPulse.com.au  Fact Sheet for Healthcare Providers: IncredibleEmployment.be  This test is not yet approved or cleared by the [place] FDA and has been authorized for detection and/or diagnosis of SARS-CoV-2 by FDA under an Emergency Use Authorization (EUA). This  EUA will remain in effect (meaning this test can be used) for the duration of the COVID-19 declaration under Section 564(b)(1) of the Act, 21 U.S.C. section 360bbb-3(b)(1), unless the authorization is terminated or revoked.  Performed at [facility], [place] [address] [place], [place] [address]   Urine Culture     Status: Abnormal   Collection Time: 12/13/20  3:59 AM   Specimen: Urine, Clean Catch  Result Value Ref Range Status   Specimen Description URINE, CLEAN CATCH  Final   Special Requests   Final    NONE Performed at [facility], [place] [address] [place], [place] [address]    Culture (A)  Final    >=100,000 COLONIES/mL KLEBSIELLA PNEUMONIAE 30,000 COLONIES/mL ENTEROCOCCUS FAECALIS    Report Status 12/16/2020 FINAL  Final   Organism ID, Bacteria KLEBSIELLA PNEUMONIAE (A)  Final   Organism ID, Bacteria ENTEROCOCCUS FAECALIS (A)  Final      Susceptibility   Enterococcus faecalis - MIC*    AMPICILLIN <=2 SENSITIVE Sensitive     NITROFURANTOIN <=16 SENSITIVE Sensitive     VANCOMYCIN 1 SENSITIVE Sensitive     * 30,000 COLONIES/mL ENTEROCOCCUS FAECALIS   Klebsiella pneumoniae - MIC*    AMPICILLIN >=32 RESISTANT Resistant     CEFAZOLIN <=4 SENSITIVE Sensitive     CEFEPIME <=0.12 SENSITIVE Sensitive     CEFTRIAXONE <=0.25 SENSITIVE Sensitive     CIPROFLOXACIN <=0.25 SENSITIVE Sensitive     GENTAMICIN <=1 SENSITIVE Sensitive     IMIPENEM <=0.25 SENSITIVE Sensitive     NITROFURANTOIN 128 RESISTANT Resistant     TRIMETH/SULFA <=20 SENSITIVE Sensitive     AMPICILLIN/SULBACTAM 4 SENSITIVE Sensitive     PIP/TAZO <=4 SENSITIVE Sensitive     * >=100,000 COLONIES/mL KLEBSIELLA PNEUMONIAE          Radiology Studies: No results found.      Scheduled Meds:  enoxaparin (LOVENOX) injection  40 mg Subcutaneous QHS    morphine injection  2 mg Intravenous Once   rosuvastatin  10 mg Oral Q Sun   Continuous Infusions:   LOS: 9 days    Time spent: 35  minutes    Mirta Mally A Lelah Rennaker, MD [facility]   If 7PM-7AM, please contact night-coverage [email or web address]  12/22/2020, 1:05 PM

## 2020-12-22 NOTE — Progress Notes (Signed)
PT Cancellation Note  Patient Details Name: Carmen Rose MRN: [id number] DOB: 1940-01-15   Cancelled Treatment:    Reason Eval/Treat Not Completed: Other (comment).  Politely declined PT and will re-attempt at another time.   Ramond Dial 12/22/2020, 3:38 PM  Mee Hives, PT PhD Acute Rehab Dept. Number: [place]

## 2020-12-23 NOTE — Progress Notes (Signed)
PROGRESS NOTE    Carmen Rose  YOV:[id number] DOB: 07-13-1940 DOA: 12/12/2020 PCP: Ginger Organ., MD   Brief Narrative: 80 year old with past medical history significant for osteoporosis, hyperlipidemia, mitral valve prolapse with recent diagnosis of nondisplaced fracture of the right superior pubic ramus 11/25 after a fall who presents to [facility] with complaints of severe pelvic, left buttock and left leg pain as well as urinary incontinence.  She was admitted to [facility] on 11/25 until 11/26 after mechanical fall and not displaced pelvic pubic ramus fracture.  Ortho also patient and did not recommended any surgical intervention.  The day of admission she was sitting down in her sofa when she felt like she dropped a bit more than she would have.  She experience worsening pain, unable to walk and came back to the hospital.  CT imaging of the pelvis was performed revealing an unchanged minimally displaced bilateral zone 1 sacral alla fracture and unchanged alignment of the mildly displaced right anterior superior pubic fracture.  MRI of the lumbar spine was additionally performed that revealed grade 1 anterolisthesis at L4-L5 with moderate foraminal stenosis.  IR consulted and sacroplasty was planning but insurance denied, currently awaiting appeal process.    Assessment & Plan:   Principal Problem:   Left buttock pain Active Problems:   Closed fracture of superior pubic ramus, right, initial encounter (HCC)   Anterolisthesis of lumbar spine   Urinary incontinence   Mixed hyperlipidemia   Closed zone 1 fracture of sacrum (HCC)   Closed zone 3 fracture of sacrum (HCC)   1-Pelvic pain, fracture: Mildly displaced right anterior acetabular/superior pubic root fracture and mildly displaced right inferior pubic ramus fracture: -Patient appears to fail conservative management and have to be hospitalized again. -IR considering sacroplasty, pending insurance authorization.   -Awaiting results form appeal. No updates. Per IR procedure might wont get approved.  -Continue with PT OT, will need SNF -Awaiting SNF, hopefully Monday.   2-Urinary incontinence due to UTI: Completed course of Keflex. Denies dysuria. Resolved.   3-Mixed Hyperlipidemia:  -Continue with statin.   4-Constipation; had multiples BM with a dose of Miralax. Change Miralax to PRN  5-HTN; started  Norvasc. Contiue   Estimated body mass index is 18.3 kg/m as calculated from the following:   Height as of this encounter: 5\' 7"  (1.702 m).   Weight as of this encounter: 53 kg.   DVT prophylaxis: Lovenox Code Status: Full code Family Communication: Care discussed with patient Disposition Plan:  Status is: Inpatient  Remains inpatient appropriate because: SNF Monday         Consultants:  Neurosurgery IR  Procedures:    Antimicrobials:    Subjective: Feeling better today, was disappointed about procedure, and she is still waiting for answer  She used to be very active, she was still working 3 days per week.   Objective: Vitals:   12/22/20 1645 12/22/20 2035 12/23/20 0504 12/23/20 0743  BP: (!) 155/84 (!) 153/78 (!) 145/88 (!) 146/88  Pulse: 85 85 79 79  Resp: 18 17 18 18   Temp: 97.7 F (36.5 C) (!) 97.3 F (36.3 C) (!) 97.5 F (36.4 C) 98.1 F (36.7 C)  TempSrc: Oral Oral Oral   SpO2: 98% 93% 99% 99%  Weight:      Height:        Intake/Output Summary (Last 24 hours) at 12/23/2020 1437 Last data filed at 12/22/2020 1844 Gross per 24 hour  Intake 220 ml  Output --  Net 220 ml    Filed Weights   12/13/20 0425  Weight: 53 kg    Examination:  General exam: NAD Respiratory system: CTA Cardiovascular system: S 1, S 2 RRR Gastrointestinal system: BS present, soft, nt Central nervous system: Alert Extremities: No edema   Data Reviewed: I have personally reviewed following labs and imaging studies  CBC: Recent Labs  Lab 12/20/20 0518  WBC 7.9   HGB 15.4*  HCT 44.5  MCV 91.2  PLT 520*    Basic Metabolic Panel: Recent Labs  Lab 12/20/20 0518 12/21/20 0824  NA 138 139  K 4.4 3.6  CL 105 105  CO2 24 23  GLUCOSE 102* 115*  BUN 8 7*  CREATININE 1.07* 0.99  CALCIUM 9.1 9.3    GFR: Estimated Creatinine Clearance: 37.9 mL/min (by C-G formula based on SCr of 0.99 mg/dL). Liver Function Tests: No results for input(s): AST, ALT, ALKPHOS, BILITOT, PROT, ALBUMIN in the last 168 hours.  No results for input(s): LIPASE, AMYLASE in the last 168 hours. No results for input(s): AMMONIA in the last 168 hours. Coagulation Profile: No results for input(s): INR, PROTIME in the last 168 hours. Cardiac Enzymes: No results for input(s): CKTOTAL, CKMB, CKMBINDEX, TROPONINI in the last 168 hours. BNP (last 3 results) No results for input(s): PROBNP in the last 8760 hours. HbA1C: No results for input(s): HGBA1C in the last 72 hours. CBG: No results for input(s): GLUCAP in the last 168 hours. Lipid Profile: No results for input(s): CHOL, HDL, LDLCALC, TRIG, CHOLHDL, LDLDIRECT in the last 72 hours. Thyroid Function Tests: No results for input(s): TSH, T4TOTAL, FREET4, T3FREE, THYROIDAB in the last 72 hours. Anemia Panel: No results for input(s): VITAMINB12, FOLATE, FERRITIN, TIBC, IRON, RETICCTPCT in the last 72 hours. Sepsis Labs: No results for input(s): PROCALCITON, LATICACIDVEN in the last 168 hours.  No results found for this or any previous visit (from the past 240 hour(s)).         Radiology Studies: No results found.      Scheduled Meds:  amLODipine  2.5 mg Oral Daily   enoxaparin (LOVENOX) injection  40 mg Subcutaneous QHS    morphine injection  2 mg Intravenous Once   rosuvastatin  10 mg Oral Q Sun   Continuous Infusions:   LOS: 10 days    Time spent: 35 minutes    Carmen Farler A Baylor Cortez, MD [facility]   If 7PM-7AM, please contact night-coverage [email or web address]  12/23/2020, 2:37 PM

## 2020-12-23 NOTE — Progress Notes (Signed)
Mobility Specialist Progress Note    12/23/20 1248  Mobility  Activity Ambulated in hall  Level of Assistance Standby assist, set-up cues, supervision of patient - no hands on  Assistive Device Front wheel walker  Distance Ambulated (ft) 140 ft  Mobility Ambulated with assistance in hallway  Mobility Response Tolerated well  Mobility performed by Mobility specialist  Bed Position Chair  $Mobility charge 1 Mobility   Pt received in bed and agreeable. No c/o on walk. Returned to chair with call bell in reach.   [facility] Mobility Specialist  M.S. Primary Phone: [phone] M.S. Secondary Phone: [phone]

## 2020-12-24 LAB — CBC
HCT: 47.6 % — ABNORMAL HIGH (ref 36.0–46.0)
Hemoglobin: 15.7 g/dL — ABNORMAL HIGH (ref 12.0–15.0)
MCH: 30.7 pg (ref 26.0–34.0)
MCHC: 33 g/dL (ref 30.0–36.0)
MCV: 93.2 fL (ref 80.0–100.0)
Platelets: 471 10*3/uL — ABNORMAL HIGH (ref 150–400)
RBC: 5.11 MIL/uL (ref 3.87–5.11)
RDW: 13.6 % (ref 11.5–15.5)
WBC: 6.4 10*3/uL (ref 4.0–10.5)
nRBC: 0 % (ref 0.0–0.2)

## 2020-12-24 LAB — BASIC METABOLIC PANEL
Anion gap: 9 (ref 5–15)
BUN: 7 mg/dL — ABNORMAL LOW (ref 8–23)
CO2: 24 mmol/L (ref 22–32)
Calcium: 9.2 mg/dL (ref 8.9–10.3)
Chloride: 103 mmol/L (ref 98–111)
Creatinine, Ser: 0.81 mg/dL (ref 0.44–1.00)
GFR, Estimated: >60 mL/min (ref >60)
Glucose, Bld: 118 mg/dL — ABNORMAL HIGH (ref 70–99)
Potassium: 3.6 mmol/L (ref 3.5–5.1)
Sodium: 136 mmol/L (ref 135–145)

## 2020-12-24 MED ORDER — AMLODIPINE BESYLATE 5 MG PO TABS: 5.0000 mg | ORAL_TABLET | Freq: Every day | ORAL | Status: DC

## 2020-12-24 MED ORDER — AMLODIPINE BESYLATE 2.5 MG PO TABS
2.5000 mg | ORAL_TABLET | Freq: Once | ORAL | Status: AC
Start: 2020-12-24 — End: 2020-12-24
  Administered 2020-12-24: 14:00:00 2.5 mg via ORAL
  Filled 2020-12-24: qty 1

## 2020-12-24 NOTE — Progress Notes (Signed)
PROGRESS NOTE    Carmen Rose  KYH:[id number] DOB: May 16, 1940 DOA: 12/12/2020 PCP: Ginger Organ., MD   Brief Narrative: 80 year old with past medical history significant for osteoporosis, hyperlipidemia, mitral valve prolapse with recent diagnosis of nondisplaced fracture of the right superior pubic ramus 11/25 after a fall who presents to [facility] with complaints of severe pelvic, left buttock and left leg pain as well as urinary incontinence.  She was admitted to [facility] on 11/25 until 11/26 after mechanical fall and not displaced pelvic pubic ramus fracture.  Ortho also patient and did not recommended any surgical intervention.  The day of admission she was sitting down in her sofa when she felt like she dropped a bit more than she would have.  She experience worsening pain, unable to walk and came back to the hospital.  CT imaging of the pelvis was performed revealing an unchanged minimally displaced bilateral zone 1 sacral alla fracture and unchanged alignment of the mildly displaced right anterior superior pubic fracture.  MRI of the lumbar spine was additionally performed that revealed grade 1 anterolisthesis at L4-L5 with moderate foraminal stenosis.  IR consulted and sacroplasty was planning but insurance denied, currently awaiting appeal process.    Assessment & Plan:   Principal Problem:   Left buttock pain Active Problems:   Closed fracture of superior pubic ramus, right, initial encounter (HCC)   Anterolisthesis of lumbar spine   Urinary incontinence   Mixed hyperlipidemia   Closed zone 1 fracture of sacrum (HCC)   Closed zone 3 fracture of sacrum (HCC)   1-Pelvic pain, fracture: Mildly displaced right anterior acetabular/superior pubic root fracture and mildly displaced right inferior pubic ramus fracture: -Patient appears to fail conservative management and have to be hospitalized again. -IR considering sacroplasty, pending insurance authorization.   -Awaiting results form appeal. No updates. Per IR procedure might wont get approved.  -Continue with PT OT, will need SNF SNF on Monday  Pain controlled.   2-Urinary incontinence due to UTI: Completed course of Keflex. Denies dysuria. Resolved.   3-Mixed Hyperlipidemia:  -Continue with statin.   4-Constipation; had multiples BM with a dose of Miralax. Change Miralax to PRN  5-HTN; Increase norvasc to 5 mg   Estimated body mass index is 18.3 kg/m as calculated from the following:   Height as of this encounter: 5\' 7"  (1.702 m).   Weight as of this encounter: 53 kg.   DVT prophylaxis: Lovenox Code Status: Full code Family Communication: Care discussed with patient Disposition Plan:  Status is: Inpatient  Remains inpatient appropriate because: SNF Monday         Consultants:  Neurosurgery IR  Procedures:    Antimicrobials:    Subjective: She denies dyspnea.  Pain is controlled.   Objective: Vitals:   12/23/20 1821 12/23/20 2003 12/24/20 0454 12/24/20 0804  BP: (!) 161/82 (!) 147/80 (!) 157/87 (!) 157/83  Pulse: 90 85 81 88  Resp: 18 16 16 19   Temp: 97.6 F (36.4 C) (!) 97.5 F (36.4 C) 97.8 F (36.6 C)   TempSrc: Oral Oral Oral   SpO2: 95% 98% 100% 97%  Weight:      Height:        Intake/Output Summary (Last 24 hours) at 12/24/2020 1309 Last data filed at 12/24/2020 0934 Gross per 24 hour  Intake 220 ml  Output --  Net 220 ml    Filed Weights   12/13/20 0425  Weight: 53 kg    Examination:  General  exam: NAD Respiratory system: CTA Cardiovascular system: S 1. S 2 RRR Gastrointestinal system: BS present, soft nt Central nervous system: Alert, follows command Extremities: No edema   Data Reviewed: I have personally reviewed following labs and imaging studies  CBC: Recent Labs  Lab 12/20/20 0518 12/24/20 0814  WBC 7.9 6.4  HGB 15.4* 15.7*  HCT 44.5 47.6*  MCV 91.2 93.2  PLT 520* 471*    Basic Metabolic Panel: Recent Labs   Lab 12/20/20 0518 12/21/20 0824 12/24/20 0814  NA 138 139 136  K 4.4 3.6 3.6  CL 105 105 103  CO2 24 23 24   GLUCOSE 102* 115* 118*  BUN 8 7* 7*  CREATININE 1.07* 0.99 0.81  CALCIUM 9.1 9.3 9.2    GFR: Estimated Creatinine Clearance: 46.3 mL/min (by C-G formula based on SCr of 0.81 mg/dL). Liver Function Tests: No results for input(s): AST, ALT, ALKPHOS, BILITOT, PROT, ALBUMIN in the last 168 hours.  No results for input(s): LIPASE, AMYLASE in the last 168 hours. No results for input(s): AMMONIA in the last 168 hours. Coagulation Profile: No results for input(s): INR, PROTIME in the last 168 hours. Cardiac Enzymes: No results for input(s): CKTOTAL, CKMB, CKMBINDEX, TROPONINI in the last 168 hours. BNP (last 3 results) No results for input(s): PROBNP in the last 8760 hours. HbA1C: No results for input(s): HGBA1C in the last 72 hours. CBG: No results for input(s): GLUCAP in the last 168 hours. Lipid Profile: No results for input(s): CHOL, HDL, LDLCALC, TRIG, CHOLHDL, LDLDIRECT in the last 72 hours. Thyroid Function Tests: No results for input(s): TSH, T4TOTAL, FREET4, T3FREE, THYROIDAB in the last 72 hours. Anemia Panel: No results for input(s): VITAMINB12, FOLATE, FERRITIN, TIBC, IRON, RETICCTPCT in the last 72 hours. Sepsis Labs: No results for input(s): PROCALCITON, LATICACIDVEN in the last 168 hours.  No results found for this or any previous visit (from the past 240 hour(s)).         Radiology Studies: No results found.      Scheduled Meds:  amLODipine  2.5 mg Oral Daily   enoxaparin (LOVENOX) injection  40 mg Subcutaneous QHS    morphine injection  2 mg Intravenous Once   rosuvastatin  10 mg Oral Q Sun   Continuous Infusions:   LOS: 11 days    Time spent: 35 minutes    Darline Faith A Cynia Abruzzo, MD [facility]   If 7PM-7AM, please contact night-coverage [email or web address]  12/24/2020, 1:09 PM

## 2020-12-24 NOTE — Progress Notes (Signed)
Mobility Specialist Progress Note    12/24/20 1604  Mobility  Activity Ambulated in room  Level of Assistance Standby assist, set-up cues, supervision of patient - no hands on  Assistive Device Front wheel walker  Distance Ambulated (ft) 10 ft (5+5)  Mobility Ambulated with assistance in room  Mobility Response Tolerated fair  Mobility performed by Mobility specialist  $Mobility charge 1 Mobility   Pt received in bed and agreeable to try and get up to chair for lunch. Once in chair, pt couldn't get comfortable and wanted to return to bed. Left with call bell in reach.   [facility] Mobility Specialist  M.S. Primary Phone: [phone] M.S. Secondary Phone: [phone]

## 2020-12-24 NOTE — Plan of Care (Signed)
  Problem: Nutrition: Goal: Adequate nutrition will be maintained Outcome: Progressing   Problem: Pain Managment: Goal: General experience of comfort will improve Outcome: Progressing   

## 2020-12-25 LAB — SARS CORONAVIRUS 2 (TAT 6-24 HRS): SARS Coronavirus 2: NEGATIVE

## 2020-12-25 MED ORDER — OXYCODONE-ACETAMINOPHEN 5-325 MG PO TABS: 1.0000 | ORAL_TABLET | ORAL | 0 refills | Status: AC | PRN

## 2020-12-25 MED ORDER — AMLODIPINE BESYLATE 10 MG PO TABS: 10.0000 mg | ORAL_TABLET | Freq: Every day | ORAL | 3 refills | Status: AC

## 2020-12-25 MED ORDER — AMLODIPINE BESYLATE 10 MG PO TABS
10.0000 mg | ORAL_TABLET | Freq: Every day | ORAL | Status: DC
  Administered 2020-12-25: 10:00:00 10 mg via ORAL
  Filled 2020-12-25: qty 1

## 2020-12-25 NOTE — Progress Notes (Signed)
Mobility Specialist Progress Note   12/25/20 1557  Mobility  Activity Ambulated in hall  Level of Assistance Contact guard assist, steadying assist  Assistive Device Front wheel walker  RLE Weight Bearing WBAT  Distance Ambulated (ft) 110 ft  Mobility Ambulated with assistance in hallway  Mobility Response Tolerated fair  Mobility performed by Mobility specialist  $Mobility charge 1 Mobility   Received pt in bed w/ no complaints and agreeable. x1 standing break d/t feelings of nausea and DOE, O2 levels de-sat to vitals listed below. RN notified. PT returned back to bed w/ VSS and call bell in reach.   During mobility: 150 HR, 83% SpO2 Post Mobility: 112 HE, 95% SpO2   Holland Falling Mobility Specialist Phone Number [phone]

## 2020-12-25 NOTE — Care Management Important Message (Signed)
Important Message  Patient Details  Name: Carmen Rose MRN: [id number] Date of Birth: Nov 21, 1940   Medicare Important Message Given:  Other (see comment)     [name] 12/25/2020, 4:04 PM

## 2020-12-25 NOTE — TOC Transition Note (Signed)
Transition of Care [facility]) - CM/SW Discharge Note   Patient Details  Name: Carmen Rose MRN: [id number] Date of Birth: Jun 22, 1940  [facility] of [facility] CM/SW Contact:  Emeterio Reeve, LCSW Phone Number: 12/25/2020, 3:44 PM   Clinical Narrative:     Per MD patient ready for DC to . RN, patient, patient's family, and facility notified of DC. Discharge Summary and FL2 sent to facility. DC packet on chart. Insurance Josem Kaufmann has been received and pt is covid negative. Ambulance transport requested for patient.    RN to call report to [phone]. Room 509P.  CSW will sign off for now as social work intervention is no longer needed. Please consult [place] again if new needs arise.   Final next level of care: Skilled Nursing Facility Barriers to Discharge: Barriers Resolved   Patient Goals and CMS Choice Patient states their goals for this hospitalization and ongoing recovery are:: to get better CMS Medicare.gov Compare Post Acute Care list provided to:: Patient Choice offered to / list presented to : Patient  Discharge Placement              Patient chooses bed at: [facility] Patient to be transferred to facility by: ptar Name of family member notified: brother Patient and family notified of of transfer: 12/25/20  Discharge Plan and Services                                     Social Determinants of Health (SDOH) Interventions     Readmission Risk Interventions No flowsheet data found.   Emeterio Reeve, LCSW Clinical Social Worker

## 2020-12-25 NOTE — Progress Notes (Signed)
Physical Therapy Treatment Patient Details Name: Carmen Rose MRN: [id number] DOB: 1940-01-12 Today's Date: 12/25/2020   History of Present Illness 80 y/o female admitted 12/6 secondary to increased pain on the L buttock. Recent non displaced R pelvic fx. Now has sacral ala fracture and noted grade 1 anterolisthesis at L4-L5 with moderate foraminal stenosis.  PMH includes anemia.    PT Comments    Patient progressing slowly towards PT goals. Very frustrated upon PT arrival due to still being in the hospital. Session focused on progressive ambulation and exercise strengthening program. Pt with complete LOB when taking UEs off RW needing assist to maintain support. Noted to have generalized weakness in BLEs and fatigues with activity. Tolerated there ex at bed level per request so she can perform on her own. Will follow.    Recommendations for follow up therapy are one component of a multi-disciplinary discharge planning process, led by the attending physician.  Recommendations may be updated based on patient status, additional functional criteria and insurance authorization.  Follow Up Recommendations  Skilled nursing-short term rehab (<3 hours/day)     Assistance Recommended at Discharge Frequent or constant Supervision/Assistance  Equipment Recommendations  None recommended by PT    Recommendations for Other Services       Precautions / Restrictions Precautions Precautions: Fall Restrictions Weight Bearing Restrictions: No     Mobility  Bed Mobility Overal bed mobility: Modified Independent             General bed mobility comments: increase time due to pain    Transfers Overall transfer level: Needs assistance Equipment used: Rolling walker (2 wheels) Transfers: Sit to/from Stand Sit to Stand: Min guard           General transfer comment: pt variable in her steadiness. dependent on fatigue, stood from EOB x1 pulling up on RW.     Ambulation/Gait Ambulation/Gait assistance: Min assist Gait Distance (Feet): 120 Feet Assistive device: Rolling walker (2 wheels) Gait Pattern/deviations: Decreased step length - right;Decreased step length - left;Decreased stride length;Shuffle Gait velocity: reduced Gait velocity interpretation: <1.8 ft/sec, indicate of risk for recurrent falls   General Gait Details: SLow, shuffling like gait with flexed trunk; unsteady throughout. incoordination RLE, 1 LOB forward when taking UEs off RW.   Stairs             Wheelchair Mobility    Modified Rankin (Stroke Patients Only)       Balance Overall balance assessment: Needs assistance Sitting-balance support: Feet supported;No upper extremity supported Sitting balance-Leahy Scale: Good     Standing balance support: During functional activity;Reliant on assistive device for balance Standing balance-Leahy Scale: Poor Standing balance comment: REliant on BUE support, LOB when taking UEs off RW                            Cognition Arousal/Alertness: Awake/alert Behavior During Therapy: WFL for tasks assessed/performed Overall Cognitive Status: No family/caregiver present to determine baseline cognitive functioning                                 General Comments: Question some behaviors/comments during session, reports being very frustrated as she thinks she is supposed to be leaving the last few days andw ants tog et to the next step (rehab) but then when asked to participate in therapy states, "I just don't think it is helping." After agreeing to  mobility, calling brother to ask him to bring her some tea.        Exercises General Exercises - Lower Extremity Quad Sets: Strengthening;Both;10 reps;Supine (5 sec hold) Heel Slides: AROM;Both;10 reps;Supine Hip ABduction/ADduction: AROM;Both;5 reps;Supine Straight Leg Raises: AROM;Both;Supine (x3) Other Exercises Other Exercises: bridging x5 with  Bil feet supported    General Comments        Pertinent Vitals/Pain Pain Assessment: 0-10 Pain Score: 8  Pain Location: back, right posterior hip Pain Descriptors / Indicators: Guarding;Sore Pain Intervention(s): Monitored during session;Repositioned;Premedicated before session;Limited activity within patient's tolerance    Home Living                          Prior Function            PT Goals (current goals can now be found in the care plan section) Progress towards PT goals: Progressing toward goals    Frequency    Min 2X/week      PT Plan Current plan remains appropriate    Co-evaluation              AM-PAC PT "6 Clicks" Mobility   Outcome Measure  Help needed turning from your back to your side while in a flat bed without using bedrails?: None Help needed moving from lying on your back to sitting on the side of a flat bed without using bedrails?: None Help needed moving to and from a bed to a chair (including a wheelchair)?: A Little Help needed standing up from a chair using your arms (e.g., wheelchair or bedside chair)?: A Little Help needed to walk in hospital room?: A Lot Help needed climbing 3-5 steps with a railing? : A Lot 6 Click Score: 18    End of Session Equipment Utilized During Treatment: Gait belt Activity Tolerance: Patient tolerated treatment well;Patient limited by fatigue Patient left: in bed;with call bell/phone within reach;with bed alarm set Nurse Communication: Mobility status PT Visit Diagnosis: Other abnormalities of gait and mobility (R26.89);History of falling (Z91.81);Pain Pain - Right/Left: Right Pain - part of body: Hip (back)     Time: 9169-4503 PT Time Calculation (min) (ACUTE ONLY): 32 min  Charges:  $Gait Training: 8-22 mins $Therapeutic Exercise: 8-22 mins                     Marisa Severin, PT, DPT Acute Rehabilitation Services Pager [phone] Office [place] 12/25/2020, 10:40 AM

## 2020-12-25 NOTE — Progress Notes (Signed)
Mobility Specialist Progress Note:   12/25/20 1000  Mobility  Activity Ambulated in hall  Level of Assistance Standby assist, set-up cues, supervision of patient - no hands on  Assistive Device Front wheel walker  Distance Ambulated (ft) 220 ft  Mobility Ambulated with assistance in hallway  Mobility Response Tolerated well  Mobility performed by Mobility specialist  $Mobility charge 1 Mobility   Pt with no complaints during ambulation. Still with minor instability when letting go of RW. Back in bed with bed alarm on.   Nelta Numbers Mobility Specialist  Phone [phone]

## 2020-12-25 NOTE — Progress Notes (Signed)
Mobility Specialist Progress Note:   12/25/20 1100  Mobility  Activity Ambulated in hall  Level of Assistance Contact guard assist, steadying assist  [place] wheel walker  Distance Ambulated (ft) 250 ft  Mobility Ambulated with assistance in hallway  Mobility Response Tolerated well  Mobility performed by Mobility specialist  $Mobility charge 1 Mobility   Pt requesting to ambulate again this morning, d/t not wanting to go to SNF and trying to prove she is ready for home. Pt with LOBs when taking BUE off RW. Pt back in bed with brother in room.  Nelta Numbers Mobility Specialist  Phone [phone]

## 2020-12-25 NOTE — Progress Notes (Signed)
IM Letter mailed to patients home due to late D/C

## 2020-12-25 NOTE — Progress Notes (Signed)
Insurance approval for sacroplasty pending. Currently denied and in the appeal process.  IR will continue to follow.  Procedure likely not to be approved.   Please call with questions or concerns.

## 2020-12-25 NOTE — Discharge Summary (Addendum)
Physician Discharge Summary  Carmen Rose OTL:[id number] DOB: October 24, 1940 DOA: 12/12/2020  PCP: Ginger Organ., MD  Admit date: 12/12/2020 Discharge date: 12/25/2020  Admitted From: Home  Disposition:  SNF  Recommendations for Outpatient Follow-up:  Follow up with PCP in 1-2 weeks Please obtain BMP/CBC in one week Follow up with orthopedic for pelvic fracture.    Discharge Condition:Stable.  CODE STATUS: Full Code Diet recommendation: Heart Healthy   Brief/Interim Summary: 80 year old with past medical history significant for osteoporosis, hyperlipidemia, mitral valve prolapse with recent diagnosis of nondisplaced fracture of the right superior pubic ramus 11/25 after a fall who presents to [facility] with complaints of severe pelvic, left buttock and left leg pain as well as urinary incontinence.  She was admitted to [facility] on 11/25 until 11/26 after mechanical fall and not displaced pelvic pubic ramus fracture.  Ortho also patient and did not recommended any surgical intervention.  The day of admission she was sitting down in her sofa when she felt like she dropped a bit more than she would have.  She experience worsening pain, unable to walk and came back to the hospital.  CT imaging of the pelvis was performed revealing an unchanged minimally displaced bilateral zone 1 sacral alla fracture and unchanged alignment of the mildly displaced right anterior superior pubic fracture.  MRI of the lumbar spine was additionally performed that revealed grade 1 anterolisthesis at L4-L5 with moderate foraminal stenosis.  IR consulted and sacroplasty was planning but insurance denied, currently awaiting appeal process.   1-Pelvic pain, fracture: Mildly displaced right anterior acetabular/superior pubic root fracture and mildly displaced right inferior pubic ramus fracture: -Patient appears to fail conservative management and have to be hospitalized again. -IR considering sacroplasty,  pending insurance authorization.  -Awaiting results form appeal. No updates. Per IR procedure might wont get approved.  -Continue with PT OT, will need SNF SNF on Monday  Pain controlled.  Stable for discharge.   2-Urinary incontinence due to UTI: Completed course of Keflex. Denies dysuria. Resolved.    3-Mixed Hyperlipidemia:  -Continue with statin.    4-Constipation; had multiples BM with a dose of Miralax. Change Miralax to PRN   5-HTN; Increase norvasc to 10 mg   underweight. BMI 18   Discharge Diagnoses:  Principal Problem:   Left buttock pain Active Problems:   Closed fracture of superior pubic ramus, right, initial encounter ([place])   Anterolisthesis of lumbar spine   Urinary incontinence   Mixed hyperlipidemia   Closed zone 1 fracture of sacrum (HCC)   Closed zone 3 fracture of sacrum [facility])    Discharge Instructions  Discharge Instructions     Diet - low sodium heart healthy   Complete by: As directed    Increase activity slowly   Complete by: As directed       Allergies as of 12/25/2020       Reactions   Alendronate    Flush face    Lipitor [atorvastatin]    Extreme exhaustion   Milk-related Compounds Diarrhea   Whole milk   Other Other (See Comments)   "pain killers" (constipation) Vitamin C(constipation) Anti inflammatory eye drops(swollen throat and vocal cords   Sudafed [pseudoephedrine Hcl] Other (See Comments)   Vertigo and sleep problems   Adhesive [tape] Rash   Caladryl [pramoxine-calamine] Rash   Contact dermatitis        Medication List     STOP taking these medications    oxyCODONE 5 MG immediate release tablet  Commonly known as: Oxy IR/ROXICODONE       TAKE these medications    acetaminophen 500 MG tablet Commonly known as: TYLENOL Take 1 tablet (500 mg total) by mouth every 6 (six) hours as needed for moderate pain or headache.   amLODipine 10 MG tablet Commonly known as: NORVASC Take 1 tablet (10 mg total) by  mouth daily. Start taking on: December 26, 2020   Biotin 1000 MCG tablet Take 1,000 mcg by mouth at bedtime.   Calcium Carb-Cholecalciferol 600-800 MG-UNIT Tabs Take 1 capsule by mouth 2 (two) times daily before a meal.   MULTIVITAMIN PO Take 1 capsule by mouth daily.   oxyCODONE-acetaminophen 5-325 MG tablet Commonly known as: PERCOCET/ROXICET Take 1 tablet by mouth every 4 (four) hours as needed for up to 5 days for moderate pain.   rosuvastatin 10 MG tablet Commonly known as: CRESTOR Take 10 mg by mouth every Sunday.   senna-docusate 8.6-50 MG tablet Commonly known as: Senokot-S Take 1 tablet by mouth at bedtime as needed for mild constipation.   tizanidine 2 MG capsule Commonly known as: ZANAFLEX Take 2 mg by mouth 3 (three) times daily as needed for muscle spasms.   VITAMIN D-3 PO Take 1,000 Units by mouth at bedtime.        Follow-up Information     [facility] Follow up.   Why: If/when IR receives further information regarding insurance approval or denial we will reach out to you. Please call our office if you have any questions. Contact information: [place] [address] [phone]                Allergies  Allergen Reactions   Alendronate     Flush face    Lipitor [Atorvastatin]     Extreme exhaustion   Milk-Related Compounds Diarrhea    Whole milk   Other Other (See Comments)    "pain killers" (constipation) Vitamin C(constipation) Anti inflammatory eye drops(swollen throat and vocal cords   Sudafed [Pseudoephedrine Hcl] Other (See Comments)    Vertigo and sleep problems   Adhesive [Tape] Rash   Caladryl [Pramoxine-Calamine] Rash    Contact dermatitis     Consultations: IR   Procedures/Studies: CT PELVIS WO CONTRAST  Result Date: 12/12/2020 CLINICAL DATA:  Pelvic trauma, fall EXAM: CT PELVIS WITHOUT CONTRAST TECHNIQUE: Multidetector CT imaging of the pelvis was performed following the  standard protocol without intravenous contrast. COMPARISON:  CT pelvis 12/10/2020 FINDINGS: Urinary Tract:  No abnormality visualized. Bowel:  Unremarkable visualized pelvic bowel loops. Vascular/Lymphatic: Aortoiliac atherosclerotic calcifications. No aneurysm. No lymphadenopathy. Reproductive:  Unremarkable. Other:  None Musculoskeletal: There are minimally displaced bilateral sacral ala fractures, unchanged from prior exam. There is notable anterior offset at S1-S2 in zone 3, suggesting there is a transverse component through the sacral body (axial image 17, sagittal image 96). No definite zone 2/neural foraminal involvement. Unchanged alignment of the mildly displaced anterior acetabular/superior pubic root fracture and mildly displaced right inferior pubic ramus fracture. Unchanged mild cortical buckling of the mid iliac wing. Generalized osteopenia. Small extraperitoneal hematoma adjacent to the anterior acetabular fracture, unchanged from the prior exam. There are no new fractures. IMPRESSION: Unchanged alignment of the mildly displaced right anterior acetabular/superior pubic root fracture and mild displaced right inferior pubis ramus fracture. Unchanged minimally displaced bilateral zone 1 sacral ala fractures, and anterior offset at S1-S2 in zone 3 consistent with a transverse component through the sacral body. Unchanged mild cortical buckling of the  mid right iliac wing. Electronically Signed   By: Maurine Simmering M.D.   On: 12/12/2020 14:41   CT PELVIS WO CONTRAST  Result Date: 12/10/2020 CLINICAL DATA:  Pelvic trauma. Recent diagnosis of right pelvic fracture. Complains of increased incontinence. EXAM: CT PELVIS WITHOUT CONTRAST TECHNIQUE: Multidetector CT imaging of the pelvis was performed following the standard protocol without intravenous contrast. COMPARISON:  No prior cross-sectional imaging. AP pelvis and right hip series are available from 12/01/2020. FINDINGS: Urinary Tract: There is slight  thickening of the bladder wall versus underdistention, mild perivesical stranding is also seen and may suggest cystitis. The distal ureters are decompressed without stones. Bowel:  No wall thickening or dilatation including the appendix. Vascular/Lymphatic: No enlarged lymph nodes. Moderate aortoiliac calcific plaques without distal AAA. Reproductive: The uterus is intact. There are calcifications in the broad ligaments. The ovaries are not enlarged. Other:  There is no free air, hemorrhage or fluid Musculoskeletal: Generalized osteopenia. There are longitudinal intra-articular fractures of the anterior aspect of both sides of the sacrum extending over several slices, both with slight cortical offset and may have been present on 12/01/2020 but would have been difficult to see. Also noted is a slight cortical fracture buckling in the central right iliac wing, a minimally displaced intra-articular fracture through the anterior column of the right acetabulum, and an oblique fracture through the mid right inferior pubic ramus with the anterior fragment offset by 1/2 of the bone width. The remainder of the sacrum, remainder of the bony pelvis, and visualized proximal femurs bilaterally intact. There is mild narrowing and spurring of the SI joints, pubic symphysis and hip joints, mild enthesopathic change of the pelvis and femoral trochanters. There are stranding changes in the right pelvic side wall anteriorly and inferiorly along side the fractures but no space-occupying pelvic hematoma. IMPRESSION: 1. Again noted recent fractures of the anterior column right acetabulum with slight displacement, and of the inferior right pubic ramus. There is only a small right hip joint effusion. 2. Periarticular longitudinal fractures of the anterior aspects of the sacrum bilaterally along the SI joints, only slightly displaced and could have been present on the recent plain films and not able to be seen. 3. Cortical buckling fracture  of the central right iliac wing. 4. Osteopenia and degenerative change without further visible fracture. 5. Aortoiliac atherosclerosis. 6. Possible cystitis. 7. Small amount of stranding along side the right-sided fractures in the pelvic wall but no space-occupying pelvic hematoma. Electronically Signed   By: Telford Nab M.D.   On: 12/10/2020 05:10   MR LUMBAR SPINE WO CONTRAST  Result Date: 12/12/2020 CLINICAL DATA:  Fall 1 week ago.  Left buttock and leg pain EXAM: MRI LUMBAR SPINE WITHOUT CONTRAST TECHNIQUE: Multiplanar, multisequence MR imaging of the lumbar spine was performed. No intravenous contrast was administered. COMPARISON:  CT pelvis 12/12/2020 FINDINGS: Segmentation:  Standard. Alignment:  Grade 1 anterolisthesis at L4-5 Vertebrae: There is hyperintense T2-weighted signal within the bone marrow at the S1-2 level, and at both sacral ala. This corresponds to known fractures demonstrated on earlier CT of the pelvis. Conus medullaris and cauda equina: Conus extends to the L2 level. Conus and cauda equina appear normal. Paraspinal and other soft tissues: 9 mm common bile duct Disc levels: L1-L2: Normal disc space and facet joints. No spinal canal stenosis. No neural foraminal stenosis. L2-L3: Normal disc space and facet joints. No spinal canal stenosis. No neural foraminal stenosis. L3-L4: Normal disc space and facet joints. No spinal canal stenosis.  No neural foraminal stenosis. L4-L5: Medium-sized disc bulge, right asymmetric. Moderate facet hypertrophy with bilateral lateral projecting synovial cysts. Both spinal canal stenosis. Moderate right and mild left neural foraminal stenosis. L5-S1: Normal disc space and facet joints. No spinal canal stenosis. No neural foraminal stenosis. Visualized sacrum: Normal. IMPRESSION: 1. Bone marrow edema at the S1-2 level, and at both sacral ala, consistent with known fractures demonstrated on earlier CT of the pelvis. 2. Grade 1 anterolisthesis at L4-L5  secondary to moderate facet arthrosis. There is moderate right and mild left neural foraminal stenosis at this level. Electronically Signed   By: Ulyses Jarred M.D.   On: 12/12/2020 20:31   DG Abd 2 Views  Result Date: 12/12/2020 CLINICAL DATA:  Constipation. Fall 1 week ago with known pelvic fractures. EXAM: ABDOMEN - 2 VIEW COMPARISON:  12/12/2020. FINDINGS: The bowel gas pattern is normal. A mild amount of retained stool is present in the colon. There is no evidence of free air. No radio-opaque calculi. Vascular calcifications are noted in the abdomen. There is mild atelectasis at the left lung base. There is bony defect of the acetabulum and inferior pubic ramus on the right, compatible with known fractures. Sacral alar fractures are also noted on the right and not well seen on the left. IMPRESSION: No bowel obstruction or free air. Mild amount of retained stool in the colon. Electronically Signed   By: Brett Fairy M.D.   On: 12/12/2020 22:38   DG Hip Unilat W or Wo Pelvis 2-3 Views Right  Result Date: 12/01/2020 CLINICAL DATA:  Fall and right hip pain. EXAM: DG HIP (WITH OR WITHOUT PELVIS) 2-3V RIGHT COMPARISON:  None FINDINGS: Nondisplaced fracture of the right superior pubic ramus. No other acute fracture identified. There is no dislocation. The bones are osteopenic. The soft tissues are unremarkable. Degenerative changes of the lower lumbar spine. IMPRESSION: Nondisplaced fracture of the right superior pubic ramus. Electronically Signed   By: Anner Crete M.D.   On: 12/01/2020 21:18     Subjective: She report pain controlled.   Discharge Exam: Vitals:   12/25/20 0459 12/25/20 0756  BP: (!) 162/87 (!) 160/82  Pulse: 93 96  Resp:  19  Temp: 97.8 F (36.6 C) 97.6 F (36.4 C)  SpO2: 97% 99%     General: Pt is alert, awake, not in acute distress Cardiovascular: RRR, S1/S2 +, no rubs, no gallops Respiratory: CTA bilaterally, no wheezing, no rhonchi Abdominal: Soft, NT, ND,  bowel sounds + Extremities: no edema, no cyanosis    The results of significant diagnostics from this hospitalization (including imaging, microbiology, ancillary and laboratory) are listed below for reference.     Microbiology: Recent Results (from the past 240 hour(s))  SARS CORONAVIRUS 2 (TAT 6-24 HRS) Nasopharyngeal Nasopharyngeal Swab     Status: None   Collection Time: 12/24/20  1:13 PM   Specimen: Nasopharyngeal Swab  Result Value Ref Range Status   SARS Coronavirus 2 NEGATIVE NEGATIVE Final    Comment: (NOTE) SARS-CoV-2 target nucleic acids are NOT DETECTED.  The SARS-CoV-2 RNA is generally detectable in upper and lower respiratory specimens during the acute phase of infection. Negative results do not preclude SARS-CoV-2 infection, do not rule out co-infections with other pathogens, and should not be used as the sole basis for treatment or other patient management decisions. Negative results must be combined with clinical observations, patient history, and epidemiological information. The expected result is Negative.  Fact Sheet for Patients: SugarRoll.be  Fact Sheet for Healthcare  Providers: https://www.woods-mathews.com/  This test is not yet approved or cleared by the [place] and  has been authorized for detection and/or diagnosis of SARS-CoV-2 by FDA under an Emergency Use Authorization (EUA). This EUA will remain  in effect (meaning this test can be used) for the duration of the COVID-19 declaration under Se ction 564(b)(1) of the Act, 21 U.S.C. section 360bbb-3(b)(1), unless the authorization is terminated or revoked sooner.  Performed at [facility], [place] [address], [place], [place] [address]      Labs: BNP (last 3 results) No results for input(s): BNP in the last 8760 hours. Basic Metabolic Panel: Recent Labs  Lab 12/20/20 0518 12/21/20 0824 12/24/20 0814  NA 138 139 136  K 4.4 3.6 3.6  CL  105 105 103  CO2 24 23 24   GLUCOSE 102* 115* 118*  BUN 8 7* 7*  CREATININE 1.07* 0.99 0.81  CALCIUM 9.1 9.3 9.2   Liver Function Tests: No results for input(s): AST, ALT, ALKPHOS, BILITOT, PROT, ALBUMIN in the last 168 hours. No results for input(s): LIPASE, AMYLASE in the last 168 hours. No results for input(s): AMMONIA in the last 168 hours. CBC: Recent Labs  Lab 12/20/20 0518 12/24/20 0814  WBC 7.9 6.4  HGB 15.4* 15.7*  HCT 44.5 47.6*  MCV 91.2 93.2  PLT 520* 471*   Cardiac Enzymes: No results for input(s): CKTOTAL, CKMB, CKMBINDEX, TROPONINI in the last 168 hours. BNP: Invalid input(s): POCBNP CBG: No results for input(s): GLUCAP in the last 168 hours. D-Dimer No results for input(s): DDIMER in the last 72 hours. Hgb A1c No results for input(s): HGBA1C in the last 72 hours. Lipid Profile No results for input(s): CHOL, HDL, LDLCALC, TRIG, CHOLHDL, LDLDIRECT in the last 72 hours. Thyroid function studies No results for input(s): TSH, T4TOTAL, T3FREE, THYROIDAB in the last 72 hours.  Invalid input(s): FREET3 Anemia work up No results for input(s): VITAMINB12, FOLATE, FERRITIN, TIBC, IRON, RETICCTPCT in the last 72 hours. Urinalysis    Component Value Date/Time   COLORURINE YELLOW 12/13/2020 0355   APPEARANCEUR HAZY (A) 12/13/2020 0355   LABSPEC 1.010 12/13/2020 0355   PHURINE 6.0 12/13/2020 0355   GLUCOSEU NEGATIVE 12/13/2020 0355   HGBUR NEGATIVE 12/13/2020 0355   BILIRUBINUR NEGATIVE 12/13/2020 0355   KETONESUR 15 (A) 12/13/2020 0355   PROTEINUR NEGATIVE 12/13/2020 0355   UROBILINOGEN 0.2 10/13/2008 1011   NITRITE POSITIVE (A) 12/13/2020 0355   LEUKOCYTESUR SMALL (A) 12/13/2020 0355   Sepsis Labs Invalid input(s): PROCALCITONIN,  WBC,  LACTICIDVEN Microbiology Recent Results (from the past 240 hour(s))  SARS CORONAVIRUS 2 (TAT 6-24 HRS) Nasopharyngeal Nasopharyngeal Swab     Status: None   Collection Time: 12/24/20  1:13 PM   Specimen: Nasopharyngeal  Swab  Result Value Ref Range Status   SARS Coronavirus 2 NEGATIVE NEGATIVE Final    Comment: (NOTE) SARS-CoV-2 target nucleic acids are NOT DETECTED.  The SARS-CoV-2 RNA is generally detectable in upper and lower respiratory specimens during the acute phase of infection. Negative results do not preclude SARS-CoV-2 infection, do not rule out co-infections with other pathogens, and should not be used as the sole basis for treatment or other patient management decisions. Negative results must be combined with clinical observations, patient history, and epidemiological information. The expected result is Negative.  Fact Sheet for Patients: SugarRoll.be  Fact Sheet for Healthcare Providers: https://www.woods-mathews.com/  This test is not yet approved or cleared by the [place] and  has been authorized  for detection and/or diagnosis of SARS-CoV-2 by FDA under an Emergency Use Authorization (EUA). This EUA will remain  in effect (meaning this test can be used) for the duration of the COVID-19 declaration under Se ction 564(b)(1) of the Act, 21 U.S.C. section 360bbb-3(b)(1), unless the authorization is terminated or revoked sooner.  Performed at [facility], [place] [address], [place], [place] [address]      Time coordinating discharge: 40 minutes  SIGNED:   Elmarie Shiley, MD  [facility]

## 2021-05-30 ENCOUNTER — Other Ambulatory Visit: Payer: Self-pay | Admitting: Internal Medicine

## 2021-05-30 DIAGNOSIS — Z1231 Encounter for screening mammogram for malignant neoplasm of breast: Secondary | ICD-10-CM

## 2021-07-02 ENCOUNTER — Ambulatory Visit
Admission: RE | Admit: 2021-07-02 | Discharge: 2021-07-02 | Disposition: A | Discharge status: Home / Self Care | Payer: Medicare Other | Source: Ambulatory Visit | Attending: Internal Medicine | Admitting: Internal Medicine

## 2021-07-02 DIAGNOSIS — Z1231 Encounter for screening mammogram for malignant neoplasm of breast: Secondary | ICD-10-CM

## 2022-05-24 ENCOUNTER — Other Ambulatory Visit: Payer: Self-pay | Admitting: Internal Medicine

## 2022-05-24 DIAGNOSIS — Z1231 Encounter for screening mammogram for malignant neoplasm of breast: Secondary | ICD-10-CM

## 2022-07-04 ENCOUNTER — Ambulatory Visit
Admission: RE | Admit: 2022-07-04 | Discharge: 2022-07-04 | Disposition: A | Discharge status: Home / Self Care | Payer: Medicare Other | Source: Ambulatory Visit | Attending: Internal Medicine | Admitting: Internal Medicine

## 2022-07-04 DIAGNOSIS — Z1231 Encounter for screening mammogram for malignant neoplasm of breast: Secondary | ICD-10-CM

## 2023-04-23 IMAGING — CT CT PELVIS W/O CM
2 of 5 series · 15 of 46 positions shown, 18 images · non-contrast
Comparison: CT pelvis 12/10/2020

CLINICAL DATA: Pelvic trauma, fall

EXAM:
CT PELVIS WITHOUT CONTRAST
TECHNIQUE: Multidetector CT imaging of the pelvis was performed following the
standard protocol without intravenous contrast.

[Series 3: soft tissue · axial · 0.72mm/px · z∈[-552,-302]mm · 12 of 56 slices shown, 15 images]
[im 4/56  soft-tissue]
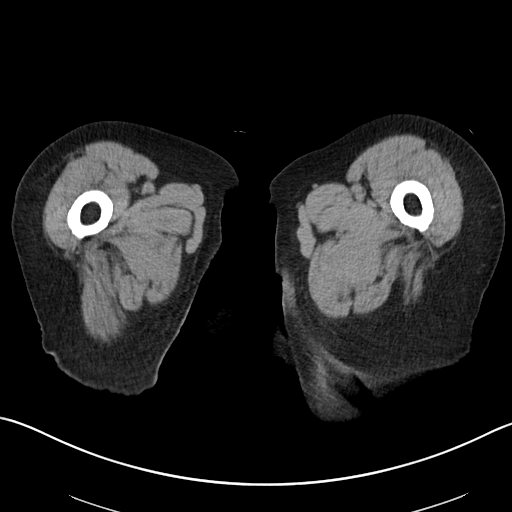
[im 4/56  bone]
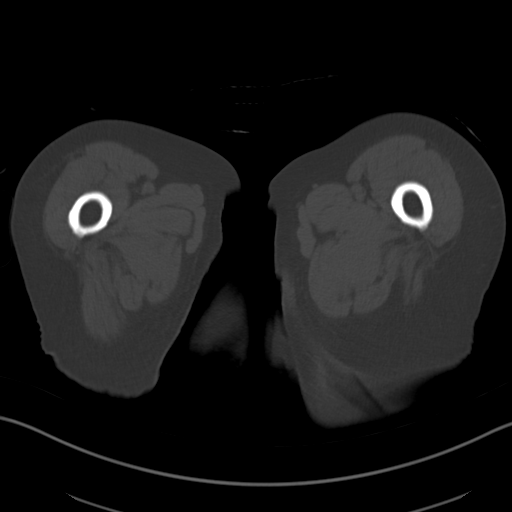
[im 11/56  soft-tissue]
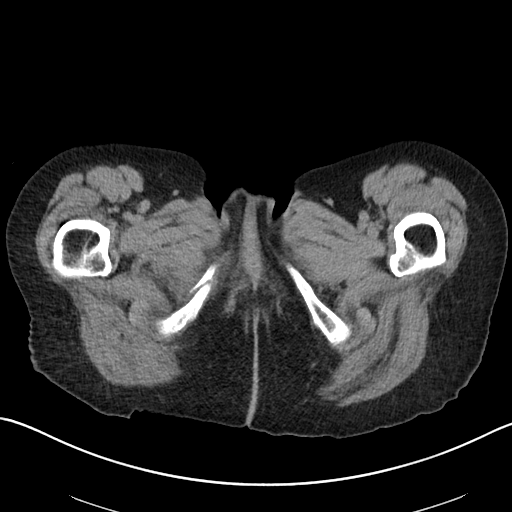
[im 16/56  soft-tissue]
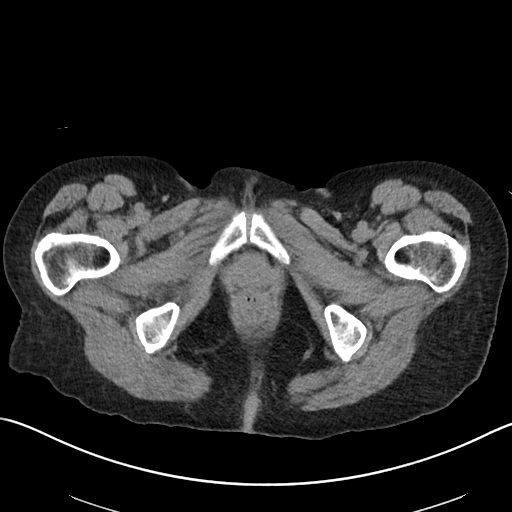
[im 22/56  soft-tissue]
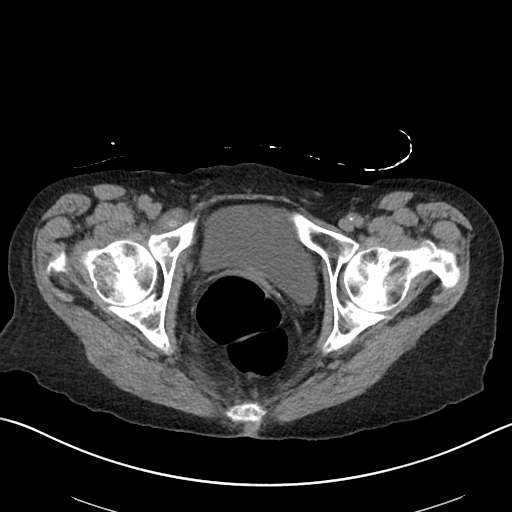
[im 29/56  soft-tissue]
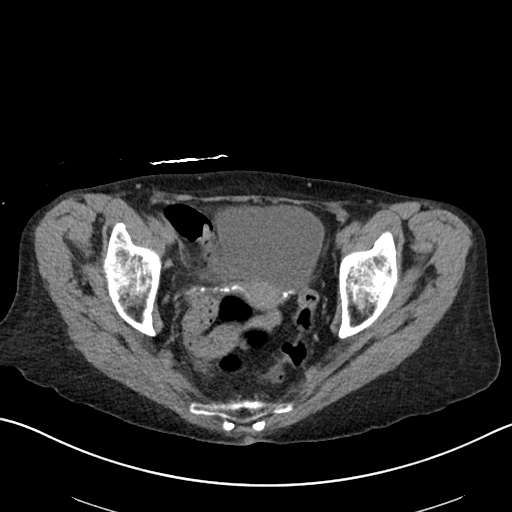
[im 34/56  soft-tissue]
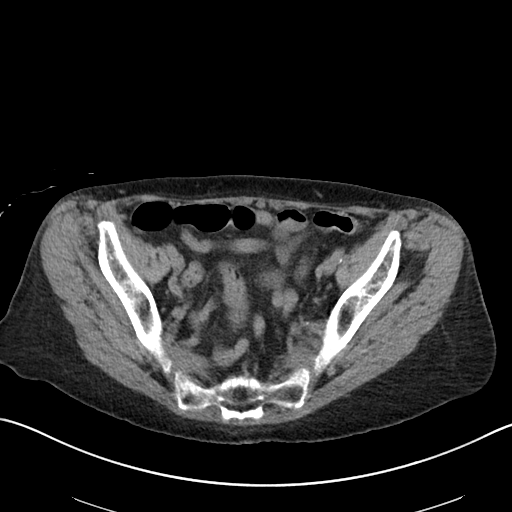
[im 40/56  soft-tissue]
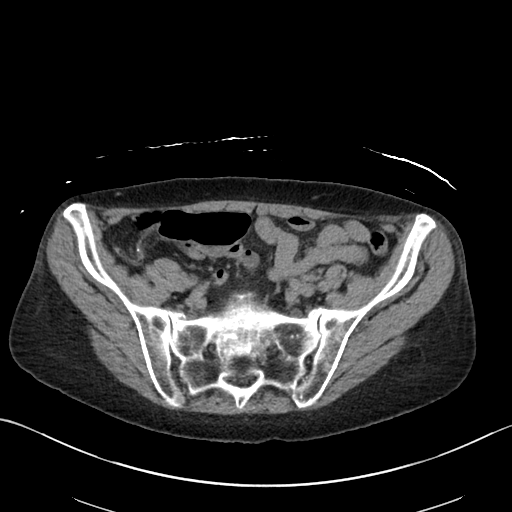
[im 47/56  soft-tissue]
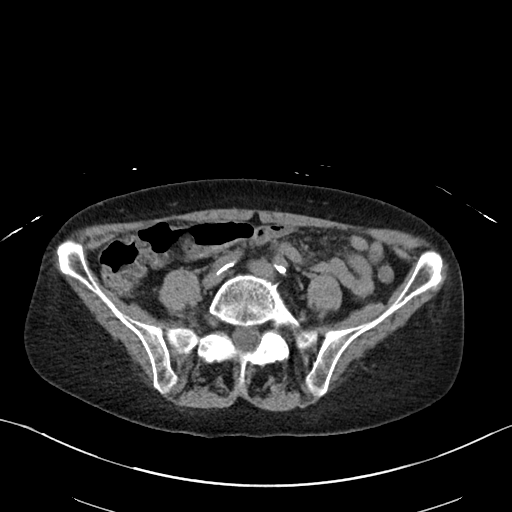
[im 48/56  lung]
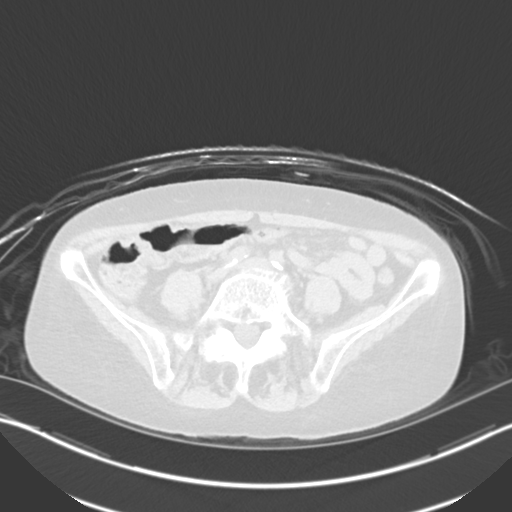
[im 50/56  lung]
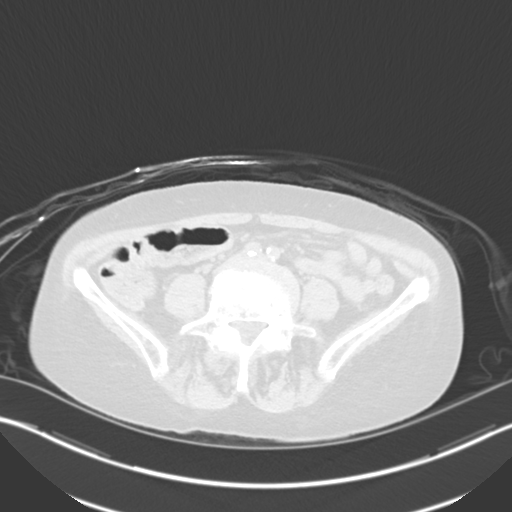
[im 52/56  soft-tissue]
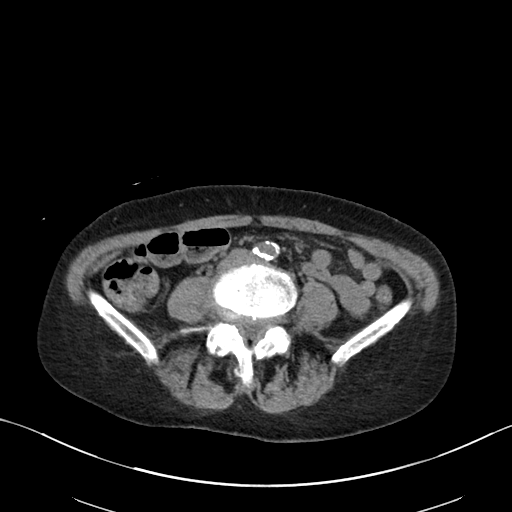
[im 52/56  lung]
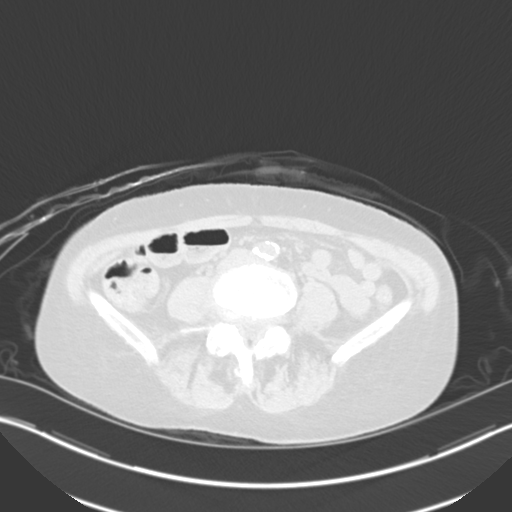
[im 52/56  bone]
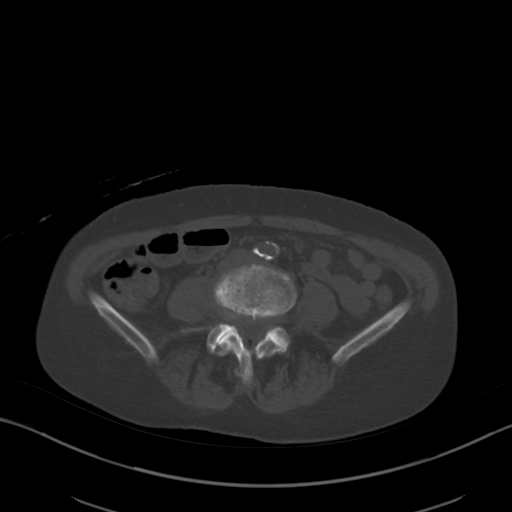
[im 54/56  lung]
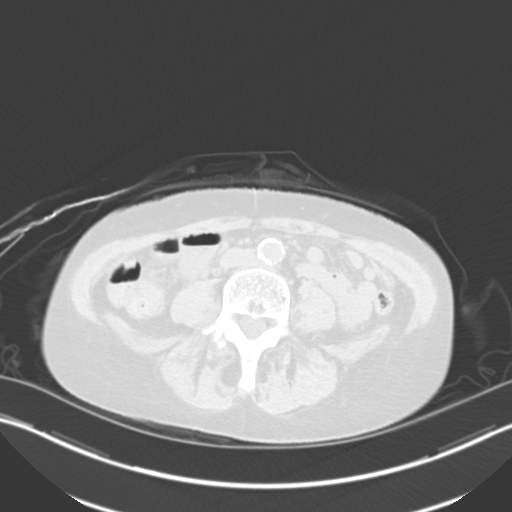

[Series 5: cor st · coronal · 0.54mm/px · 3 of 125 slices shown]
[im 32/125  soft-tissue]
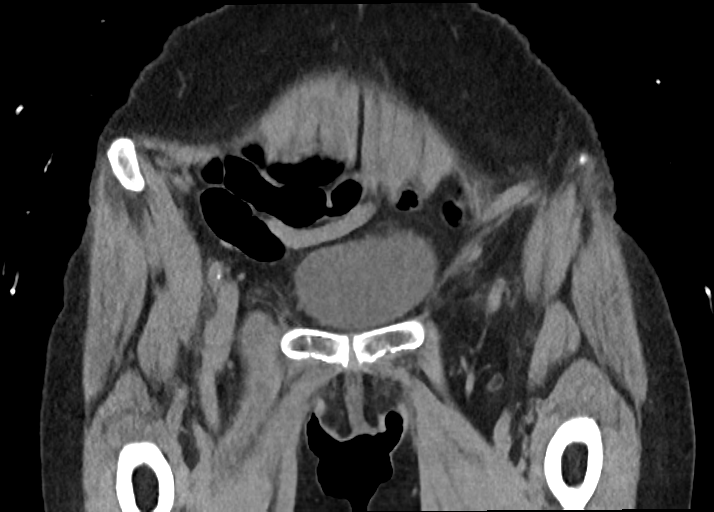
[im 63/125  soft-tissue]
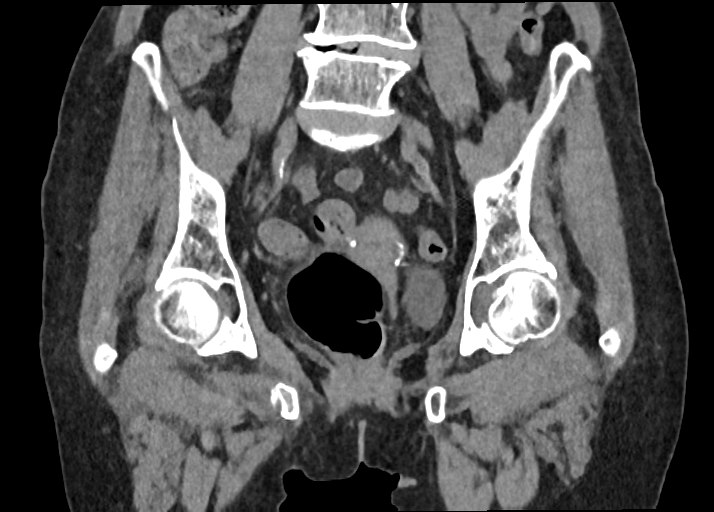
[im 94/125  soft-tissue]
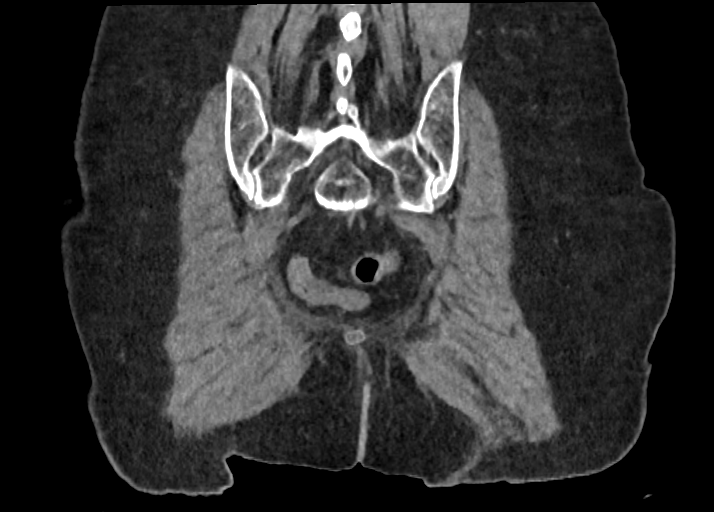

[15 of 46 positions shown; findings below may reference images not displayed]

FINDINGS: Urinary Tract:  No abnormality visualized.

Bowel:  Unremarkable visualized pelvic bowel loops.

Vascular/Lymphatic: Aortoiliac atherosclerotic calcifications. No
aneurysm. No lymphadenopathy.

Reproductive:  Unremarkable.

Other:  None

Musculoskeletal: There are minimally displaced bilateral sacral ala
fractures, unchanged from prior exam. There is notable anterior
offset at S1-S2 in zone 3, suggesting there is a transverse
component through the sacral body (axial image 17, sagittal image
96). No definite zone 2/neural foraminal involvement. Unchanged
alignment of the mildly displaced anterior acetabular/superior pubic
root fracture and mildly displaced right inferior pubic ramus
fracture. Unchanged mild cortical buckling of the mid iliac wing.
Generalized osteopenia. Small extraperitoneal hematoma adjacent to
the anterior acetabular fracture, unchanged from the prior exam.
There are no new fractures.
IMPRESSION: Unchanged alignment of the mildly displaced right anterior
acetabular/superior pubic root fracture and mild displaced right
inferior pubis ramus fracture.

Unchanged minimally displaced bilateral zone 1 sacral ala fractures,
and anterior offset at S1-S2 in zone 3 consistent with a transverse
component through the sacral body.

Unchanged mild cortical buckling of the mid right iliac wing.

## 2023-04-23 IMAGING — MR MR LUMBAR SPINE W/O CM
4 of 5 series · 26 of 48 positions shown · non-contrast
Comparison: CT pelvis 12/12/2020

CLINICAL DATA: Fall 1 week ago.  Left buttock and leg pain

EXAM:
MRI LUMBAR SPINE WITHOUT CONTRAST
TECHNIQUE: Multiplanar, multisequence MR imaging of the lumbar spine was
performed. No intravenous contrast was administered.

[Series 5: T2 · sagittal · 4.0mm · 0.73mm/px · 6 of 17 slices shown (1 of 2)]
[im 1/17]
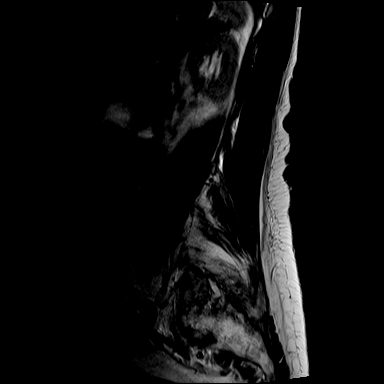
[im 4/17]
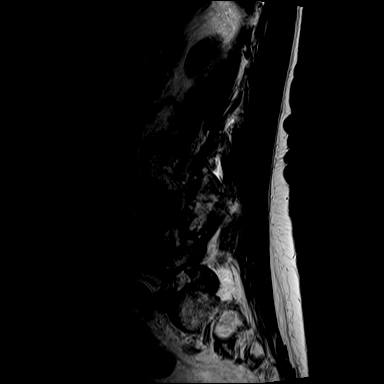
[im 7/17]
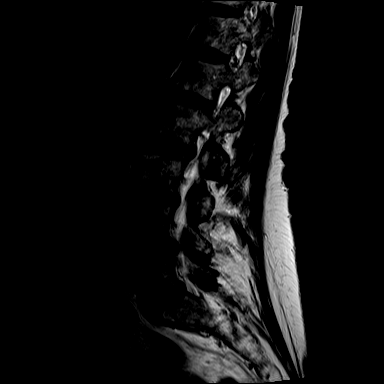
[im 10/17]
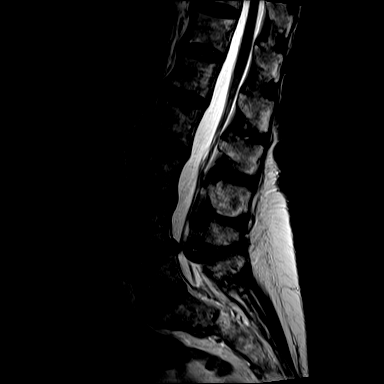
[im 13/17]
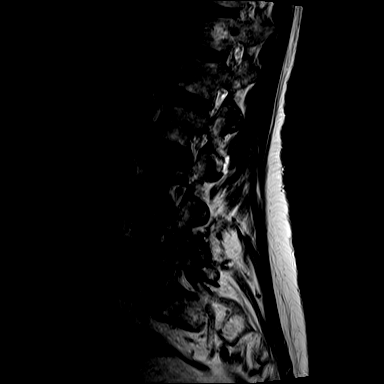
[im 17/17]
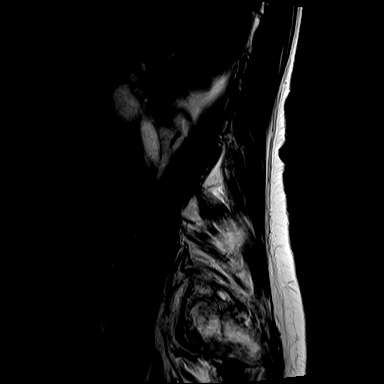

[Series 7: T1 · sagittal · 4.0mm · 0.88mm/px · 6 of 17 slices shown (1 of 2)]
[im 1/17]
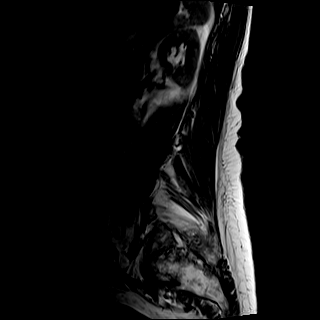
[im 4/17]
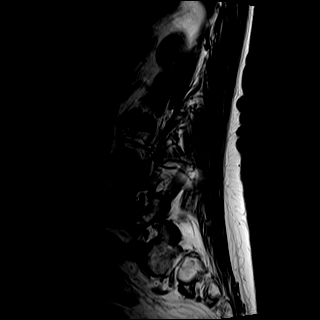
[im 7/17]
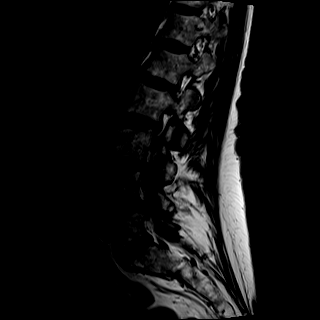
[im 10/17]
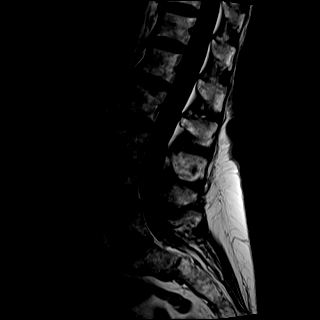
[im 13/17]
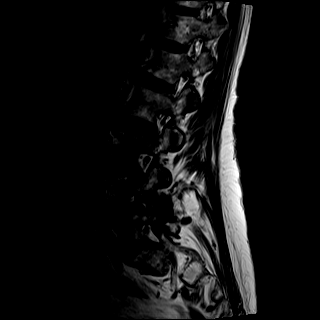
[im 17/17]
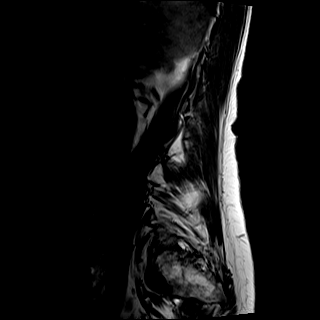

[Series 8: T2 · axial · 4.0mm · 0.57mm/px · z∈[-92,+143]mm · 9 of 39 slices shown (2 of 2)]
[im 1/39]
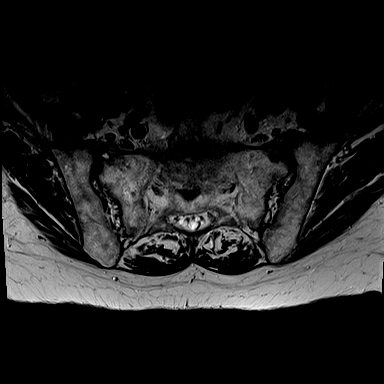
[im 6/39]
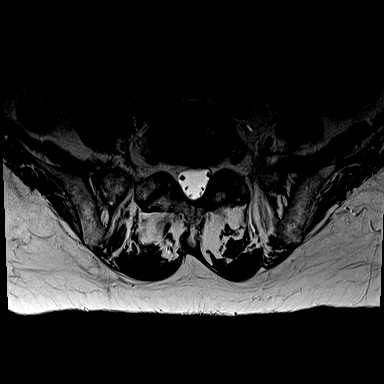
[im 11/39]
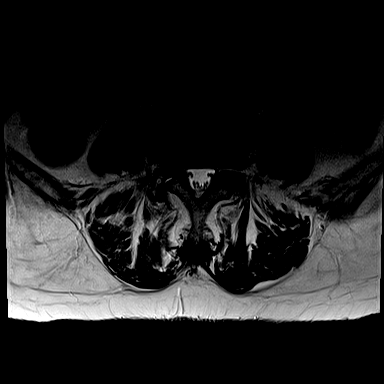
[im 17/39]
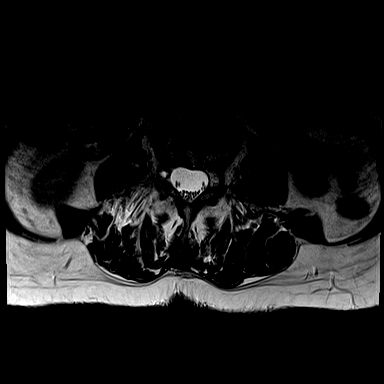
[im 20/39]
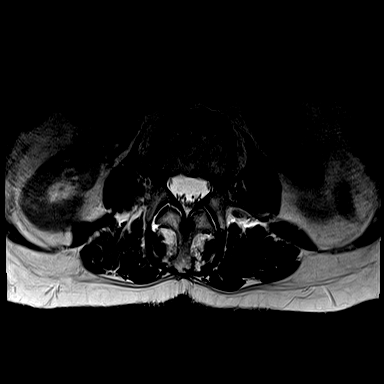
[im 22/39]
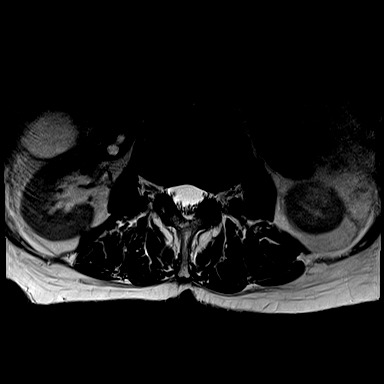
[im 28/39]
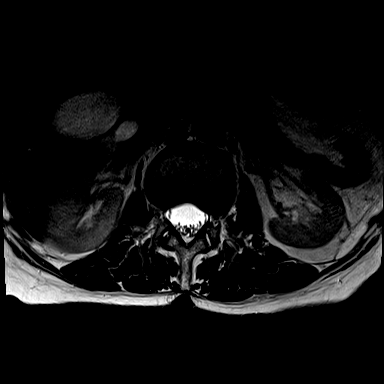
[im 33/39]
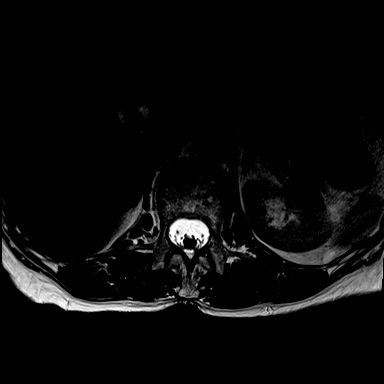
[im 39/39]
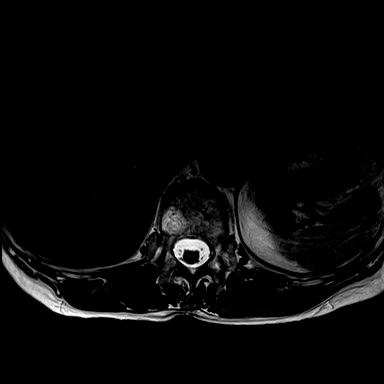

[Series 9: T1 · axial · 4.0mm · 0.34mm/px · z∈[-92,+113]mm · 5 of 39 slices shown (2 of 2)]
[im 1/39]
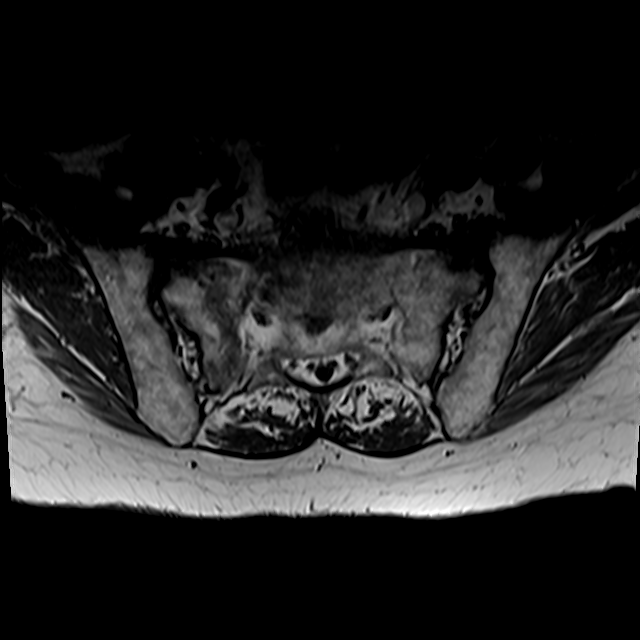
[im 6/39]
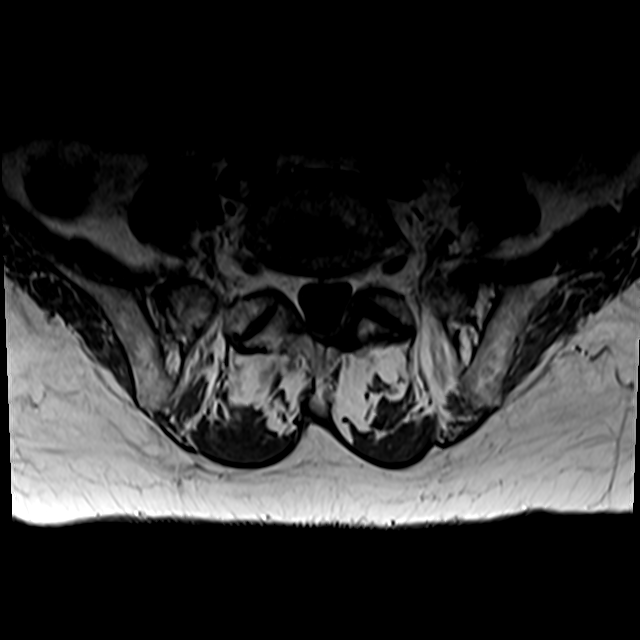
[im 11/39]
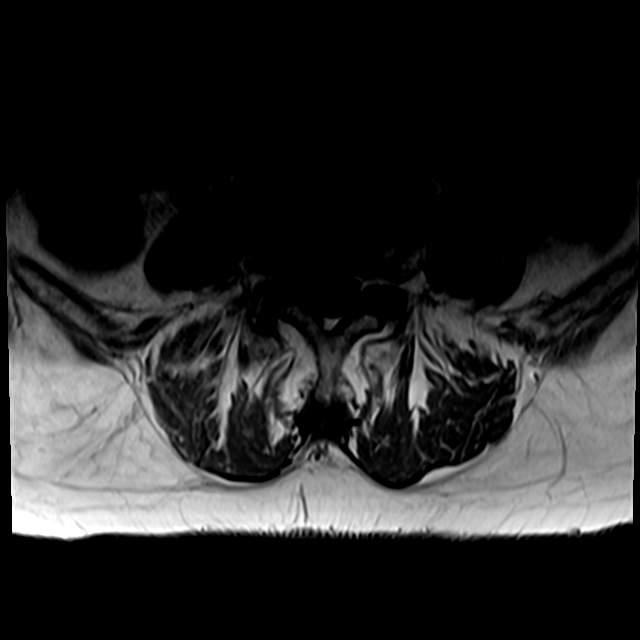
[im 20/39]
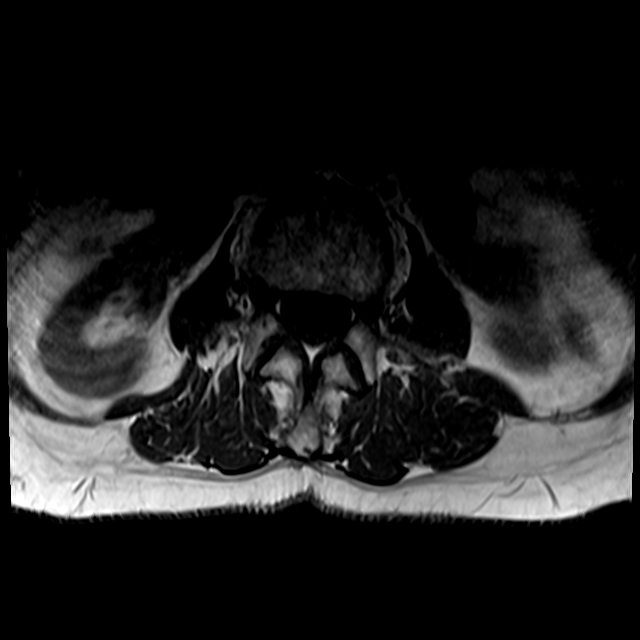
[im 33/39]
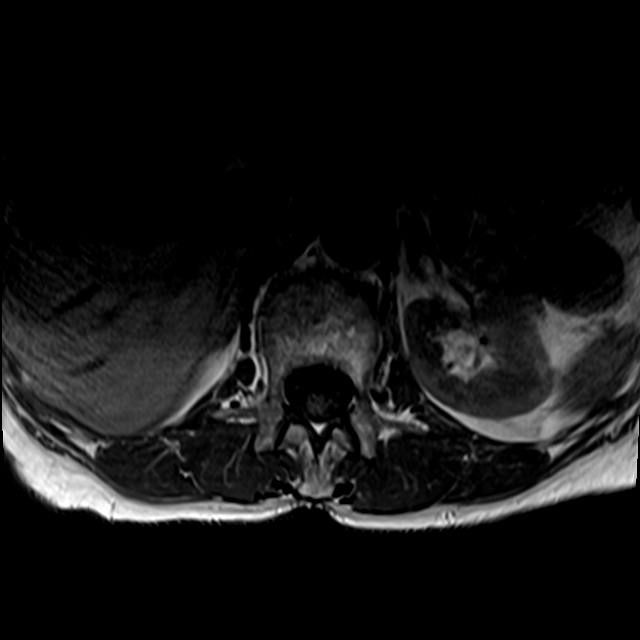

[26 of 48 positions shown; findings below may reference images not displayed]

FINDINGS: Segmentation:  Standard.

Alignment:  Grade 1 anterolisthesis at L4-5

Vertebrae: There is hyperintense T2-weighted signal within the bone
marrow at the S1-2 level, and at both sacral ala. This corresponds
to known fractures demonstrated on earlier CT of the pelvis.

Conus medullaris and cauda equina: Conus extends to the L2 level.
Conus and cauda equina appear normal.

Paraspinal and other soft tissues: 9 mm common bile duct

Disc levels:

L1-L2: Normal disc space and facet joints. No spinal canal stenosis.
No neural foraminal stenosis.

L2-L3: Normal disc space and facet joints. No spinal canal stenosis.
No neural foraminal stenosis.

L3-L4: Normal disc space and facet joints. No spinal canal stenosis.
No neural foraminal stenosis.

L4-L5: Medium-sized disc bulge, right asymmetric. Moderate facet
hypertrophy with bilateral lateral projecting synovial cysts. Both
spinal canal stenosis. Moderate right and mild left neural foraminal
stenosis.

L5-S1: Normal disc space and facet joints. No spinal canal stenosis.
No neural foraminal stenosis.

Visualized sacrum: Normal.
IMPRESSION: 1. Bone marrow edema at the S1-2 level, and at both sacral ala,
consistent with known fractures demonstrated on earlier CT of the
pelvis.
2. Grade 1 anterolisthesis at L4-L5 secondary to moderate facet
arthrosis. There is moderate right and mild left neural foraminal
stenosis at this level.

## 2023-05-26 ENCOUNTER — Other Ambulatory Visit: Payer: Self-pay | Admitting: Internal Medicine

## 2023-05-26 DIAGNOSIS — Z1231 Encounter for screening mammogram for malignant neoplasm of breast: Secondary | ICD-10-CM

## 2023-07-07 ENCOUNTER — Ambulatory Visit
Admission: RE | Admit: 2023-07-07 | Discharge: 2023-07-07 | Disposition: A | Discharge status: Home / Self Care | Source: Ambulatory Visit | Attending: Internal Medicine | Admitting: Internal Medicine

## 2023-07-07 DIAGNOSIS — Z1231 Encounter for screening mammogram for malignant neoplasm of breast: Secondary | ICD-10-CM
